# Patient Record
Sex: Female | Born: 1940 | Race: White | Hispanic: No | Marital: Married | Smoking: Current every day smoker
Health system: Southern US, Community
[De-identification: ages and names within clinical notes are randomized; demographics above are authoritative.]

## PROBLEM LIST (undated history)

## (undated) DIAGNOSIS — I10 Essential (primary) hypertension: Secondary | ICD-10-CM

## (undated) DIAGNOSIS — J449 Chronic obstructive pulmonary disease, unspecified: Secondary | ICD-10-CM

## (undated) HISTORY — PX: ABDOMINAL HYSTERECTOMY: SHX81

---

## 2004-06-28 ENCOUNTER — Ambulatory Visit: Payer: Self-pay | Admitting: Internal Medicine

## 2004-11-15 ENCOUNTER — Ambulatory Visit: Payer: Self-pay | Admitting: Internal Medicine

## 2005-01-08 ENCOUNTER — Ambulatory Visit: Payer: Self-pay | Admitting: Unknown Physician Specialty

## 2005-03-25 ENCOUNTER — Ambulatory Visit: Payer: Self-pay

## 2005-10-28 ENCOUNTER — Ambulatory Visit: Payer: Self-pay | Admitting: Ophthalmology

## 2006-04-22 ENCOUNTER — Ambulatory Visit: Payer: Self-pay | Admitting: Internal Medicine

## 2007-09-16 ENCOUNTER — Ambulatory Visit: Payer: Self-pay | Admitting: Internal Medicine

## 2008-01-26 ENCOUNTER — Inpatient Hospital Stay: Payer: Self-pay | Admitting: Specialist

## 2008-01-26 ENCOUNTER — Other Ambulatory Visit: Payer: Self-pay

## 2008-04-10 ENCOUNTER — Emergency Department: Payer: Self-pay | Admitting: Emergency Medicine

## 2008-07-18 ENCOUNTER — Inpatient Hospital Stay: Payer: Self-pay | Admitting: Specialist

## 2008-09-28 ENCOUNTER — Ambulatory Visit: Payer: Self-pay | Admitting: Internal Medicine

## 2012-05-21 ENCOUNTER — Emergency Department: Payer: Self-pay | Admitting: Emergency Medicine

## 2012-05-29 ENCOUNTER — Ambulatory Visit: Payer: Self-pay | Admitting: Orthopedic Surgery

## 2013-03-29 ENCOUNTER — Inpatient Hospital Stay: Payer: Self-pay | Admitting: Internal Medicine

## 2013-03-29 LAB — URINALYSIS, COMPLETE
Bilirubin,UR: NEGATIVE
Blood: NEGATIVE
Glucose,UR: NEGATIVE mg/dL (ref 0–75)
Leukocyte Esterase: NEGATIVE
Nitrite: NEGATIVE
Ph: 9 (ref 4.5–8.0)
Protein: NEGATIVE
Squamous Epithelial: 1
WBC UR: <1 /HPF (ref 0–5)

## 2013-03-29 LAB — CBC
HCT: 47.5 % — ABNORMAL HIGH (ref 35.0–47.0)
MCH: 28.8 pg (ref 26.0–34.0)
WBC: 18.1 10*3/uL — ABNORMAL HIGH (ref 3.6–11.0)

## 2013-03-29 LAB — COMPREHENSIVE METABOLIC PANEL
Albumin: 3.7 g/dL (ref 3.4–5.0)
Alkaline Phosphatase: 112 U/L (ref 50–136)
Anion Gap: 4 — ABNORMAL LOW (ref 7–16)
Bilirubin,Total: 0.8 mg/dL (ref 0.2–1.0)
Calcium, Total: 9.6 mg/dL (ref 8.5–10.1)
Chloride: 90 mmol/L — ABNORMAL LOW (ref 98–107)
Co2: 35 mmol/L — ABNORMAL HIGH (ref 21–32)
Creatinine: 0.53 mg/dL — ABNORMAL LOW (ref 0.60–1.30)
Osmolality: 259 (ref 275–301)
Potassium: 4.2 mmol/L (ref 3.5–5.1)
SGPT (ALT): 32 U/L (ref 12–78)
Sodium: 129 mmol/L — ABNORMAL LOW (ref 136–145)
Total Protein: 8.1 g/dL (ref 6.4–8.2)

## 2013-03-29 LAB — CK TOTAL AND CKMB (NOT AT ARMC)
CK, Total: 156 U/L (ref 21–215)
CK-MB: 3.6 ng/mL (ref 0.5–3.6)

## 2013-03-30 LAB — CBC WITH DIFFERENTIAL/PLATELET
Basophil #: 0.1 10*3/uL (ref 0.0–0.1)
Eosinophil #: 0 10*3/uL (ref 0.0–0.7)
Eosinophil %: 0.3 %
HCT: 38.7 % (ref 35.0–47.0)
HGB: 13.2 g/dL (ref 12.0–16.0)
MCHC: 34.1 g/dL (ref 32.0–36.0)
Monocyte #: 0.8 x10 3/mm (ref 0.2–0.9)
Neutrophil #: 9 10*3/uL — ABNORMAL HIGH (ref 1.4–6.5)
Neutrophil %: 82.9 %
Platelet: 207 10*3/uL (ref 150–440)
RBC: 4.54 10*6/uL (ref 3.80–5.20)

## 2013-03-30 LAB — BASIC METABOLIC PANEL
Anion Gap: 4 — ABNORMAL LOW (ref 7–16)
BUN: 14 mg/dL (ref 7–18)
Chloride: 95 mmol/L — ABNORMAL LOW (ref 98–107)
Co2: 35 mmol/L — ABNORMAL HIGH (ref 21–32)
EGFR (Non-African Amer.): >60
Potassium: 3.9 mmol/L (ref 3.5–5.1)

## 2013-04-02 LAB — CBC WITH DIFFERENTIAL/PLATELET
Basophil #: 0.4 10*3/uL — ABNORMAL HIGH (ref 0.0–0.1)
Eosinophil #: 0 10*3/uL (ref 0.0–0.7)
Eosinophil %: 0.1 %
HCT: 43.6 % (ref 35.0–47.0)
HGB: 14.8 g/dL (ref 12.0–16.0)
Lymphocyte #: 0.2 10*3/uL — ABNORMAL LOW (ref 1.0–3.6)
Lymphocyte %: 0.9 %
MCH: 28.3 pg (ref 26.0–34.0)
MCHC: 33.8 g/dL (ref 32.0–36.0)
MCV: 84 fL (ref 80–100)
Monocyte #: 0.8 x10 3/mm (ref 0.2–0.9)
Monocyte %: 3.1 %
Neutrophil #: 25 10*3/uL — ABNORMAL HIGH (ref 1.4–6.5)
Neutrophil %: 94.5 %
RBC: 5.21 10*6/uL — ABNORMAL HIGH (ref 3.80–5.20)
RDW: 13.5 % (ref 11.5–14.5)
WBC: 26.4 10*3/uL — ABNORMAL HIGH (ref 3.6–11.0)

## 2013-04-03 LAB — CBC WITH DIFFERENTIAL/PLATELET
Basophil #: 0.1 10*3/uL (ref 0.0–0.1)
Eosinophil #: 0 10*3/uL (ref 0.0–0.7)
Eosinophil %: 0 %
HCT: 41.7 % (ref 35.0–47.0)
HGB: 13.9 g/dL (ref 12.0–16.0)
Lymphocyte #: 0.3 10*3/uL — ABNORMAL LOW (ref 1.0–3.6)
Lymphocyte %: 1.1 %
MCHC: 33.3 g/dL (ref 32.0–36.0)
MCV: 85 fL (ref 80–100)
Monocyte #: 0.6 x10 3/mm (ref 0.2–0.9)
RDW: 13.7 % (ref 11.5–14.5)

## 2013-04-04 LAB — CREATININE, SERUM
Creatinine: 0.77 mg/dL (ref 0.60–1.30)
EGFR (African American): >60

## 2013-04-05 LAB — CBC WITH DIFFERENTIAL/PLATELET
HCT: 38 % (ref 35.0–47.0)
Lymphocyte %: 1.6 %
MCH: 28.5 pg (ref 26.0–34.0)
MCV: 85 fL (ref 80–100)
Monocyte %: 3.5 %
Neutrophil %: 94.9 %
Platelet: 280 10*3/uL (ref 150–440)
RBC: 4.49 10*6/uL (ref 3.80–5.20)
RDW: 13.5 % (ref 11.5–14.5)

## 2013-04-06 LAB — VANCOMYCIN, TROUGH: Vancomycin, Trough: 4 ug/mL — ABNORMAL LOW (ref 10–20)

## 2014-02-16 ENCOUNTER — Emergency Department: Payer: Self-pay | Admitting: Internal Medicine

## 2014-02-16 LAB — COMPREHENSIVE METABOLIC PANEL
ALK PHOS: 99 U/L
ANION GAP: 3 — AB (ref 7–16)
Albumin: 3.5 g/dL (ref 3.4–5.0)
BILIRUBIN TOTAL: 0.6 mg/dL (ref 0.2–1.0)
BUN: 47 mg/dL — AB (ref 7–18)
CALCIUM: 9.1 mg/dL (ref 8.5–10.1)
CO2: 43 mmol/L — AB (ref 21–32)
Chloride: 87 mmol/L — ABNORMAL LOW (ref 98–107)
Creatinine: 0.86 mg/dL (ref 0.60–1.30)
EGFR (African American): >60
EGFR (Non-African Amer.): >60
Glucose: 115 mg/dL — ABNORMAL HIGH (ref 65–99)
Osmolality: 280 (ref 275–301)
Potassium: 4.7 mmol/L (ref 3.5–5.1)
SGOT(AST): 43 U/L — ABNORMAL HIGH (ref 15–37)
SGPT (ALT): 31 U/L
Sodium: 133 mmol/L — ABNORMAL LOW (ref 136–145)
TOTAL PROTEIN: 7.9 g/dL (ref 6.4–8.2)

## 2014-02-16 LAB — CK TOTAL AND CKMB (NOT AT ARMC)
CK, Total: 168 U/L
CK-MB: 6.5 ng/mL — AB (ref 0.5–3.6)

## 2014-02-16 LAB — URINALYSIS, COMPLETE
BILIRUBIN, UR: NEGATIVE
GLUCOSE, UR: NEGATIVE mg/dL (ref 0–75)
KETONE: NEGATIVE
LEUKOCYTE ESTERASE: NEGATIVE
Nitrite: POSITIVE
PH: 5 (ref 4.5–8.0)
Protein: NEGATIVE
SPECIFIC GRAVITY: 1.01 (ref 1.003–1.030)

## 2014-02-16 LAB — TROPONIN I: Troponin-I: 0.02 ng/mL

## 2014-02-16 LAB — CBC
HCT: 43.2 % (ref 35.0–47.0)
HGB: 13.9 g/dL (ref 12.0–16.0)
MCH: 27.8 pg (ref 26.0–34.0)
MCHC: 32.3 g/dL (ref 32.0–36.0)
MCV: 86 fL (ref 80–100)
PLATELETS: 212 10*3/uL (ref 150–440)
RBC: 5.01 10*6/uL (ref 3.80–5.20)
RDW: 14.2 % (ref 11.5–14.5)
WBC: 9.6 10*3/uL (ref 3.6–11.0)

## 2014-02-20 ENCOUNTER — Emergency Department (HOSPITAL_COMMUNITY): Payer: Medicare HMO

## 2014-02-20 ENCOUNTER — Encounter (HOSPITAL_COMMUNITY): Payer: Self-pay | Admitting: Emergency Medicine

## 2014-02-20 ENCOUNTER — Inpatient Hospital Stay (HOSPITAL_COMMUNITY)
Admission: EM | Admit: 2014-02-20 | Discharge: 2014-02-24 | DRG: 190 | Disposition: A | Discharge status: Home Health | Payer: Medicare HMO | Attending: Internal Medicine | Admitting: Internal Medicine

## 2014-02-20 DIAGNOSIS — J441 Chronic obstructive pulmonary disease with (acute) exacerbation: Secondary | ICD-10-CM | POA: Diagnosis not present

## 2014-02-20 DIAGNOSIS — A0472 Enterocolitis due to Clostridium difficile, not specified as recurrent: Secondary | ICD-10-CM

## 2014-02-20 DIAGNOSIS — R7309 Other abnormal glucose: Secondary | ICD-10-CM | POA: Diagnosis not present

## 2014-02-20 DIAGNOSIS — Y92009 Unspecified place in unspecified non-institutional (private) residence as the place of occurrence of the external cause: Secondary | ICD-10-CM

## 2014-02-20 DIAGNOSIS — R9389 Abnormal findings on diagnostic imaging of other specified body structures: Secondary | ICD-10-CM

## 2014-02-20 DIAGNOSIS — F17200 Nicotine dependence, unspecified, uncomplicated: Secondary | ICD-10-CM | POA: Diagnosis present

## 2014-02-20 DIAGNOSIS — J9621 Acute and chronic respiratory failure with hypoxia: Secondary | ICD-10-CM

## 2014-02-20 DIAGNOSIS — T380X5A Adverse effect of glucocorticoids and synthetic analogues, initial encounter: Secondary | ICD-10-CM | POA: Diagnosis not present

## 2014-02-20 DIAGNOSIS — W010XXA Fall on same level from slipping, tripping and stumbling without subsequent striking against object, initial encounter: Secondary | ICD-10-CM | POA: Diagnosis present

## 2014-02-20 DIAGNOSIS — I951 Orthostatic hypotension: Secondary | ICD-10-CM

## 2014-02-20 DIAGNOSIS — J9620 Acute and chronic respiratory failure, unspecified whether with hypoxia or hypercapnia: Secondary | ICD-10-CM | POA: Diagnosis present

## 2014-02-20 DIAGNOSIS — Z23 Encounter for immunization: Secondary | ICD-10-CM

## 2014-02-20 DIAGNOSIS — R55 Syncope and collapse: Secondary | ICD-10-CM | POA: Diagnosis not present

## 2014-02-20 DIAGNOSIS — Z9981 Dependence on supplemental oxygen: Secondary | ICD-10-CM

## 2014-02-20 DIAGNOSIS — E43 Unspecified severe protein-calorie malnutrition: Secondary | ICD-10-CM | POA: Diagnosis present

## 2014-02-20 DIAGNOSIS — R918 Other nonspecific abnormal finding of lung field: Secondary | ICD-10-CM | POA: Diagnosis present

## 2014-02-20 DIAGNOSIS — I10 Essential (primary) hypertension: Secondary | ICD-10-CM

## 2014-02-20 DIAGNOSIS — Z681 Body mass index (BMI) 19 or less, adult: Secondary | ICD-10-CM

## 2014-02-20 HISTORY — DX: Essential (primary) hypertension: I10

## 2014-02-20 HISTORY — DX: Chronic obstructive pulmonary disease, unspecified: J44.9

## 2014-02-20 LAB — CBC
HCT: 45 % (ref 36.0–46.0)
HEMOGLOBIN: 14 g/dL (ref 12.0–15.0)
MCH: 28.2 pg (ref 26.0–34.0)
MCHC: 31.1 g/dL (ref 30.0–36.0)
MCV: 90.5 fL (ref 78.0–100.0)
Platelets: 192 10*3/uL (ref 150–400)
RBC: 4.97 MIL/uL (ref 3.87–5.11)
RDW: 14.4 % (ref 11.5–15.5)
WBC: 7.7 10*3/uL (ref 4.0–10.5)

## 2014-02-20 LAB — I-STAT CHEM 8, ED
BUN: 51 mg/dL — ABNORMAL HIGH (ref 6–23)
CHLORIDE: 86 meq/L — AB (ref 96–112)
Calcium, Ion: 1.12 mmol/L — ABNORMAL LOW (ref 1.13–1.30)
Creatinine, Ser: 1.3 mg/dL — ABNORMAL HIGH (ref 0.50–1.10)
GLUCOSE: 132 mg/dL — AB (ref 70–99)
HEMATOCRIT: 48 % — AB (ref 36.0–46.0)
Hemoglobin: 16.3 g/dL — ABNORMAL HIGH (ref 12.0–15.0)
Potassium: 4.1 mEq/L (ref 3.7–5.3)
Sodium: 136 mEq/L — ABNORMAL LOW (ref 137–147)
TCO2: 45 mmol/L (ref 0–100)

## 2014-02-20 LAB — COMPREHENSIVE METABOLIC PANEL
ALK PHOS: 82 U/L (ref 39–117)
ALT: 19 U/L (ref 0–35)
AST: 19 U/L (ref 0–37)
Albumin: 3.7 g/dL (ref 3.5–5.2)
Anion gap: 8 (ref 5–15)
BUN: 52 mg/dL — ABNORMAL HIGH (ref 6–23)
CHLORIDE: 89 meq/L — AB (ref 96–112)
CO2: 44 mEq/L (ref 19–32)
CREATININE: 0.98 mg/dL (ref 0.50–1.10)
Calcium: 9.4 mg/dL (ref 8.4–10.5)
GFR calc non Af Amer: 55 mL/min — ABNORMAL LOW (ref >90)
GFR, EST AFRICAN AMERICAN: 64 mL/min — AB (ref >90)
GLUCOSE: 130 mg/dL — AB (ref 70–99)
POTASSIUM: 4.3 meq/L (ref 3.7–5.3)
Sodium: 141 mEq/L (ref 137–147)
Total Bilirubin: 0.2 mg/dL — ABNORMAL LOW (ref 0.3–1.2)
Total Protein: 7.1 g/dL (ref 6.0–8.3)

## 2014-02-20 LAB — I-STAT TROPONIN, ED: Troponin i, poc: 0.02 ng/mL (ref 0.00–0.08)

## 2014-02-20 SURGERY — LEFT HEART CATH
Anesthesia: LOCAL | Laterality: Right

## 2014-02-20 MED ORDER — IPRATROPIUM BROMIDE 0.02 % IN SOLN
1.0000 mg | Freq: Once | RESPIRATORY_TRACT | Status: AC
  Administered 2014-02-20: 1 mg via RESPIRATORY_TRACT
  Filled 2014-02-20: qty 5

## 2014-02-20 MED ORDER — METHYLPREDNISOLONE SODIUM SUCC 125 MG IJ SOLR
125.0000 mg | Freq: Once | INTRAMUSCULAR | Status: AC
  Administered 2014-02-20: 125 mg via INTRAVENOUS
  Filled 2014-02-20: qty 2

## 2014-02-20 MED ORDER — IPRATROPIUM-ALBUTEROL 0.5-2.5 (3) MG/3ML IN SOLN: 3.0000 mL | RESPIRATORY_TRACT | Status: DC

## 2014-02-20 MED ORDER — ALBUTEROL (5 MG/ML) CONTINUOUS INHALATION SOLN
10.0000 mg/h | INHALATION_SOLUTION | Freq: Once | RESPIRATORY_TRACT | Status: AC
  Administered 2014-02-20: 10 mg/h via RESPIRATORY_TRACT
  Filled 2014-02-20: qty 20

## 2014-02-20 MED ORDER — IPRATROPIUM-ALBUTEROL 0.5-2.5 (3) MG/3ML IN SOLN
3.0000 mL | Freq: Once | RESPIRATORY_TRACT | Status: AC
  Administered 2014-02-20: 3 mL via RESPIRATORY_TRACT
  Filled 2014-02-20: qty 3

## 2014-02-20 NOTE — ED Notes (Signed)
Pt to ED via EMS with code STEMI called on route. Per EMS pt found fell over a table and complaining generalized body ache, EKG Showed ST-elevation. EMS given 324mg  ASA, 1 inch nitro past. Per EMS, BP-142/68, CBG-131, O2-95% on 2L.

## 2014-02-20 NOTE — ED Notes (Signed)
Code STEMI cancelled per Dr. Gwendolyn GrantWalden and cardiologist.

## 2014-02-20 NOTE — ED Provider Notes (Signed)
CSN: [id number]     Arrival date & time 02/20/14  2248 History   First MD Initiated Contact with Patient 02/20/14 2304     Chief Complaint  Patient presents with  . Code STEMI     (Consider location/radiation/quality/duration/timing/severity/associated sxs/prior Treatment) Patient is a 73 y.o. female presenting with syncope. The history is provided by the patient.  Loss of Consciousness Episode history:  Single Most recent episode:  Today Timing:  Constant Progression:  Resolved Chronicity:  New Context comment:  Was eating Witnessed: yes   Relieved by:  Nothing Worsened by:  Nothing tried Associated symptoms: chest pain, difficulty breathing and shortness of breath   Associated symptoms: no diaphoresis, no fever, no nausea, no recent injury and no vomiting     Past Medical History  Diagnosis Date  . COPD (chronic obstructive pulmonary disease)    History reviewed. No pertinent past surgical history. History reviewed. No pertinent family history. History  Substance Use Topics  . Smoking status: Current Every Day Smoker -- 1.00 packs/day    Types: Cigarettes  . Smokeless tobacco: Not on file  . Alcohol Use: Not on file   OB History   Grav Para Term Preterm Abortions TAB SAB Ect Mult Living                 Review of Systems  Constitutional: Negative for fever and diaphoresis.  Respiratory: Positive for shortness of breath. Negative for cough.   Cardiovascular: Positive for chest pain and syncope.  Gastrointestinal: Negative for nausea and vomiting.  All other systems reviewed and are negative.     Allergies  Review of patient's allergies indicates no known allergies.  Home Medications   Prior to Admission medications   Not on File   BP 128/76  Pulse 90  Resp 11  SpO2 100% Physical Exam  Nursing note and vitals reviewed. Constitutional: She is oriented to person, place, and time. She appears well-developed and well-nourished. She appears distressed.   HENT:  Head: Normocephalic and atraumatic.  Mouth/Throat: Oropharynx is clear and moist.  Eyes: EOM are normal. Pupils are equal, round, and reactive to light.  Neck: Normal range of motion. Neck supple.  Cardiovascular: Normal rate and regular rhythm.  Exam reveals no friction rub.   No murmur heard. Pulmonary/Chest: She is in respiratory distress. She has wheezes (diffuse, moderate). She has no rales.  Prolonged expiratory phase  Abdominal: Soft. She exhibits no distension. There is no tenderness. There is no rebound.  Musculoskeletal: Normal range of motion. She exhibits no edema.  Neurological: She is alert and oriented to person, place, and time. No cranial nerve deficit. She exhibits normal muscle tone. Coordination normal.  Skin: No rash noted. She is not diaphoretic.    ED Course  Procedures (including critical care time) Labs Review Labs Reviewed  I-STAT CHEM 8, ED - Abnormal; Notable for the following:    Sodium 136 (*)    Chloride 86 (*)    BUN 51 (*)    Creatinine, Ser 1.30 (*)    Glucose, Bld 132 (*)    Calcium, Ion 1.12 (*)    Hemoglobin 16.3 (*)    HCT 48.0 (*)    All other components within normal limits  CBC  COMPREHENSIVE METABOLIC PANEL  I-STAT TROPOININ, ED    Imaging Review No results found.   EKG Interpretation None      MDM   Final diagnoses:  COPD exacerbation    49F with hx of COPD presents as  Code STEMI. Syncopized while eating, complaining of generalized body aches, difficulty breathing on EMS arrival. EKG concerning for STEMI to EMS in V3 and V4. Code STEMI cancelled once we reviewed EKG. Having prolonged expiratory phase with diffuse wheezing and SOB. Satting ok, but visibly in respiratory distress. Will put on BiPap and give albuterol/ipratropium. After one hour on BiPap, great improvement. Labs ok. Patient feeling better. Will admit due to patient's severe respiratory distress upon arrival for further management.    Dagmar Hait, MD 02/21/14 [phone]

## 2014-02-21 ENCOUNTER — Emergency Department (HOSPITAL_COMMUNITY): Payer: Medicare HMO

## 2014-02-21 ENCOUNTER — Encounter (HOSPITAL_COMMUNITY): Payer: Self-pay | Admitting: Internal Medicine

## 2014-02-21 DIAGNOSIS — J441 Chronic obstructive pulmonary disease with (acute) exacerbation: Secondary | ICD-10-CM | POA: Diagnosis present

## 2014-02-21 DIAGNOSIS — Z23 Encounter for immunization: Secondary | ICD-10-CM | POA: Diagnosis not present

## 2014-02-21 DIAGNOSIS — J9620 Acute and chronic respiratory failure, unspecified whether with hypoxia or hypercapnia: Secondary | ICD-10-CM | POA: Insufficient documentation

## 2014-02-21 DIAGNOSIS — W010XXA Fall on same level from slipping, tripping and stumbling without subsequent striking against object, initial encounter: Secondary | ICD-10-CM | POA: Diagnosis present

## 2014-02-21 DIAGNOSIS — R918 Other nonspecific abnormal finding of lung field: Secondary | ICD-10-CM | POA: Diagnosis present

## 2014-02-21 DIAGNOSIS — R55 Syncope and collapse: Secondary | ICD-10-CM

## 2014-02-21 DIAGNOSIS — Z681 Body mass index (BMI) 19 or less, adult: Secondary | ICD-10-CM | POA: Diagnosis not present

## 2014-02-21 DIAGNOSIS — Y92009 Unspecified place in unspecified non-institutional (private) residence as the place of occurrence of the external cause: Secondary | ICD-10-CM | POA: Diagnosis not present

## 2014-02-21 DIAGNOSIS — T380X5A Adverse effect of glucocorticoids and synthetic analogues, initial encounter: Secondary | ICD-10-CM | POA: Diagnosis not present

## 2014-02-21 DIAGNOSIS — E43 Unspecified severe protein-calorie malnutrition: Secondary | ICD-10-CM | POA: Diagnosis present

## 2014-02-21 DIAGNOSIS — Z9981 Dependence on supplemental oxygen: Secondary | ICD-10-CM | POA: Diagnosis not present

## 2014-02-21 DIAGNOSIS — F17200 Nicotine dependence, unspecified, uncomplicated: Secondary | ICD-10-CM | POA: Diagnosis present

## 2014-02-21 DIAGNOSIS — I951 Orthostatic hypotension: Secondary | ICD-10-CM | POA: Diagnosis present

## 2014-02-21 DIAGNOSIS — R0902 Hypoxemia: Secondary | ICD-10-CM

## 2014-02-21 DIAGNOSIS — I10 Essential (primary) hypertension: Secondary | ICD-10-CM | POA: Diagnosis present

## 2014-02-21 DIAGNOSIS — I369 Nonrheumatic tricuspid valve disorder, unspecified: Secondary | ICD-10-CM

## 2014-02-21 DIAGNOSIS — R7309 Other abnormal glucose: Secondary | ICD-10-CM | POA: Diagnosis not present

## 2014-02-21 DIAGNOSIS — A0472 Enterocolitis due to Clostridium difficile, not specified as recurrent: Secondary | ICD-10-CM | POA: Diagnosis present

## 2014-02-21 LAB — COMPREHENSIVE METABOLIC PANEL
ALT: 15 U/L (ref 0–35)
AST: 15 U/L (ref 0–37)
Albumin: 3.1 g/dL — ABNORMAL LOW (ref 3.5–5.2)
Alkaline Phosphatase: 72 U/L (ref 39–117)
Anion gap: 7 (ref 5–15)
BUN: 48 mg/dL — ABNORMAL HIGH (ref 6–23)
CALCIUM: 8.8 mg/dL (ref 8.4–10.5)
CO2: 45 meq/L — AB (ref 19–32)
Chloride: 88 mEq/L — ABNORMAL LOW (ref 96–112)
Creatinine, Ser: 0.96 mg/dL (ref 0.50–1.10)
GFR, EST AFRICAN AMERICAN: 66 mL/min — AB (ref >90)
GFR, EST NON AFRICAN AMERICAN: 57 mL/min — AB (ref >90)
GLUCOSE: 307 mg/dL — AB (ref 70–99)
Potassium: 4.2 mEq/L (ref 3.7–5.3)
Sodium: 140 mEq/L (ref 137–147)
Total Bilirubin: <0.2 mg/dL — ABNORMAL LOW (ref 0.3–1.2)
Total Protein: 5.9 g/dL — ABNORMAL LOW (ref 6.0–8.3)

## 2014-02-21 LAB — CBC WITH DIFFERENTIAL/PLATELET
Basophils Absolute: 0 10*3/uL (ref 0.0–0.1)
Basophils Relative: 0 % (ref 0–1)
Eosinophils Absolute: 0 10*3/uL (ref 0.0–0.7)
Eosinophils Relative: 0 % (ref 0–5)
HCT: 42.9 % (ref 36.0–46.0)
Hemoglobin: 13 g/dL (ref 12.0–15.0)
LYMPHS ABS: 0.1 10*3/uL — AB (ref 0.7–4.0)
Lymphocytes Relative: 1 % — ABNORMAL LOW (ref 12–46)
MCH: 27.9 pg (ref 26.0–34.0)
MCHC: 30.3 g/dL (ref 30.0–36.0)
MCV: 92.1 fL (ref 78.0–100.0)
Monocytes Absolute: 0 10*3/uL — ABNORMAL LOW (ref 0.1–1.0)
Monocytes Relative: 0 % — ABNORMAL LOW (ref 3–12)
NEUTROS ABS: 7.5 10*3/uL (ref 1.7–7.7)
NEUTROS PCT: 98 % — AB (ref 43–77)
PLATELETS: 172 10*3/uL (ref 150–400)
RBC: 4.66 MIL/uL (ref 3.87–5.11)
RDW: 14.3 % (ref 11.5–15.5)
WBC: 7.6 10*3/uL (ref 4.0–10.5)

## 2014-02-21 LAB — URINALYSIS, ROUTINE W REFLEX MICROSCOPIC
Bilirubin Urine: NEGATIVE
Glucose, UA: 100 mg/dL — AB
HGB URINE DIPSTICK: NEGATIVE
KETONES UR: NEGATIVE mg/dL
NITRITE: NEGATIVE
Protein, ur: NEGATIVE mg/dL
Specific Gravity, Urine: 1.015 (ref 1.005–1.030)
Urobilinogen, UA: 0.2 mg/dL (ref 0.0–1.0)
pH: 7 (ref 5.0–8.0)

## 2014-02-21 LAB — D-DIMER, QUANTITATIVE: D-Dimer, Quant: 0.42 ug/mL-FEU (ref 0.00–0.48)

## 2014-02-21 LAB — PRO B NATRIURETIC PEPTIDE: Pro B Natriuretic peptide (BNP): 216.7 pg/mL — ABNORMAL HIGH (ref 0–125)

## 2014-02-21 LAB — URINE MICROSCOPIC-ADD ON

## 2014-02-21 LAB — TROPONIN I: Troponin I: <0.3 ng/mL (ref <0.30)

## 2014-02-21 MED ORDER — ONDANSETRON HCL 4 MG PO TABS: 4.0000 mg | ORAL_TABLET | Freq: Four times a day (QID) | ORAL | Status: DC | PRN

## 2014-02-21 MED ORDER — DOXYCYCLINE HYCLATE 100 MG PO TABS
100.0000 mg | ORAL_TABLET | Freq: Once | ORAL | Status: AC
  Administered 2014-02-21: 100 mg via ORAL
  Filled 2014-02-21: qty 1

## 2014-02-21 MED ORDER — PREDNISONE 20 MG PO TABS
60.0000 mg | ORAL_TABLET | Freq: Once | ORAL | Status: AC
  Administered 2014-02-21: 60 mg via ORAL
  Filled 2014-02-21: qty 3

## 2014-02-21 MED ORDER — LEVOFLOXACIN 500 MG PO TABS
500.0000 mg | ORAL_TABLET | Freq: Once | ORAL | Status: AC
  Administered 2014-02-21: 500 mg via ORAL
  Filled 2014-02-21: qty 1

## 2014-02-21 MED ORDER — TETANUS-DIPHTH-ACELL PERTUSSIS 5-2.5-18.5 LF-MCG/0.5 IM SUSP
0.5000 mL | Freq: Once | INTRAMUSCULAR | Status: AC
  Administered 2014-02-21: 0.5 mL via INTRAMUSCULAR
  Filled 2014-02-21 (×2): qty 0.5

## 2014-02-21 MED ORDER — POTASSIUM CHLORIDE CRYS ER 20 MEQ PO TBCR
20.0000 meq | EXTENDED_RELEASE_TABLET | Freq: Every day | ORAL | Status: DC
  Filled 2014-02-21: qty 1

## 2014-02-21 MED ORDER — SODIUM CHLORIDE 0.9 % IV SOLN
Freq: Once | INTRAVENOUS | Status: AC
  Administered 2014-02-21: 12:00:00 via INTRAVENOUS

## 2014-02-21 MED ORDER — ENOXAPARIN SODIUM 30 MG/0.3ML ~~LOC~~ SOLN
15.0000 mg | SUBCUTANEOUS | Status: DC
  Administered 2014-02-21 – 2014-02-24 (×4): 15 mg via SUBCUTANEOUS
  Filled 2014-02-21 (×6): qty 0.15

## 2014-02-21 MED ORDER — IPRATROPIUM BROMIDE 0.02 % IN SOLN: 0.5000 mg | Freq: Four times a day (QID) | RESPIRATORY_TRACT | Status: DC

## 2014-02-21 MED ORDER — ALPRAZOLAM 0.25 MG PO TABS
0.2500 mg | ORAL_TABLET | Freq: Three times a day (TID) | ORAL | Status: DC | PRN
  Administered 2014-02-21 – 2014-02-23 (×3): 0.25 mg via ORAL
  Filled 2014-02-21 (×3): qty 1

## 2014-02-21 MED ORDER — BUDESONIDE 0.25 MG/2ML IN SUSP
0.2500 mg | Freq: Two times a day (BID) | RESPIRATORY_TRACT | Status: DC
  Administered 2014-02-21 – 2014-02-24 (×6): 0.25 mg via RESPIRATORY_TRACT
  Filled 2014-02-21 (×9): qty 2

## 2014-02-21 MED ORDER — ONDANSETRON HCL 4 MG/2ML IJ SOLN: 4.0000 mg | Freq: Four times a day (QID) | INTRAMUSCULAR | Status: DC | PRN

## 2014-02-21 MED ORDER — ENOXAPARIN SODIUM 40 MG/0.4ML ~~LOC~~ SOLN
40.0000 mg | SUBCUTANEOUS | Status: DC
  Filled 2014-02-21: qty 0.4

## 2014-02-21 MED ORDER — SODIUM CHLORIDE 0.9 % IJ SOLN
3.0000 mL | Freq: Two times a day (BID) | INTRAMUSCULAR | Status: DC
Start: 2014-02-21 — End: 2014-02-24
  Administered 2014-02-21 – 2014-02-24 (×7): 3 mL via INTRAVENOUS

## 2014-02-21 MED ORDER — ALBUTEROL SULFATE (2.5 MG/3ML) 0.083% IN NEBU: 2.5000 mg | INHALATION_SOLUTION | RESPIRATORY_TRACT | Status: DC

## 2014-02-21 MED ORDER — ALBUTEROL SULFATE (2.5 MG/3ML) 0.083% IN NEBU
2.5000 mg | INHALATION_SOLUTION | RESPIRATORY_TRACT | Status: DC | PRN
  Administered 2014-02-21 – 2014-02-22 (×2): 2.5 mg via RESPIRATORY_TRACT
  Filled 2014-02-21 (×2): qty 3

## 2014-02-21 MED ORDER — TRAZODONE HCL 50 MG PO TABS
50.0000 mg | ORAL_TABLET | Freq: Every evening | ORAL | Status: DC | PRN
  Administered 2014-02-21: 50 mg via ORAL
  Filled 2014-02-21: qty 1

## 2014-02-21 MED ORDER — ENSURE COMPLETE PO LIQD
237.0000 mL | Freq: Three times a day (TID) | ORAL | Status: DC
  Administered 2014-02-21 – 2014-02-24 (×9): 237 mL via ORAL

## 2014-02-21 MED ORDER — METHYLPREDNISOLONE SODIUM SUCC 125 MG IJ SOLR
60.0000 mg | Freq: Four times a day (QID) | INTRAMUSCULAR | Status: AC
  Administered 2014-02-21 – 2014-02-22 (×6): 60 mg via INTRAVENOUS
  Filled 2014-02-21 (×8): qty 0.96

## 2014-02-21 MED ORDER — DILTIAZEM HCL ER 120 MG PO CP24
120.0000 mg | ORAL_CAPSULE | Freq: Every day | ORAL | Status: DC
  Administered 2014-02-21 – 2014-02-24 (×4): 120 mg via ORAL
  Filled 2014-02-21 (×5): qty 1

## 2014-02-21 MED ORDER — LEVOFLOXACIN 250 MG PO TABS
250.0000 mg | ORAL_TABLET | Freq: Every day | ORAL | Status: DC
  Administered 2014-02-22 – 2014-02-23 (×2): 250 mg via ORAL
  Filled 2014-02-21 (×2): qty 1

## 2014-02-21 MED ORDER — DOXYCYCLINE HYCLATE 100 MG IV SOLR
100.0000 mg | Freq: Two times a day (BID) | INTRAVENOUS | Status: DC
  Filled 2014-02-21 (×2): qty 100

## 2014-02-21 MED ORDER — ACETAMINOPHEN 650 MG RE SUPP: 650.0000 mg | Freq: Four times a day (QID) | RECTAL | Status: DC | PRN

## 2014-02-21 MED ORDER — ACETAMINOPHEN 325 MG PO TABS
650.0000 mg | ORAL_TABLET | Freq: Four times a day (QID) | ORAL | Status: DC | PRN
  Administered 2014-02-22: 650 mg via ORAL
  Filled 2014-02-21: qty 2

## 2014-02-21 MED ORDER — TORSEMIDE 10 MG PO TABS
10.0000 mg | ORAL_TABLET | Freq: Every day | ORAL | Status: DC
  Filled 2014-02-21: qty 1

## 2014-02-21 MED ORDER — IPRATROPIUM-ALBUTEROL 0.5-2.5 (3) MG/3ML IN SOLN
3.0000 mL | Freq: Four times a day (QID) | RESPIRATORY_TRACT | Status: DC
  Administered 2014-02-21 – 2014-02-22 (×6): 3 mL via RESPIRATORY_TRACT
  Filled 2014-02-21 (×6): qty 3

## 2014-02-21 MED ORDER — IPRATROPIUM-ALBUTEROL 0.5-2.5 (3) MG/3ML IN SOLN: 3.0000 mL | Freq: Three times a day (TID) | RESPIRATORY_TRACT | Status: DC

## 2014-02-21 NOTE — Progress Notes (Signed)
  Echocardiogram 2D Echocardiogram has been performed.  Mindy Daniels, [name] 02/21/2014, 10:19 AM

## 2014-02-21 NOTE — H&P (Signed)
[facility] History and Physical  Mindy FoundMartha J Snarski GNF:[id number] DOB: 06/07/1941 DOA: 02/20/2014  Referring physician: ER physician. PCP: No primary provider on file.   Chief Complaint: Fall.  HPI: Mindy Daniels is a 73 y.o. female with history of COPD on home oxygen and ongoing tobacco abuse, hypertension was brought to the ER after patient had a fall at her house. Patient states that she fell over her coffee table. Patient states that she is not able to explain exactly what happened but she suddenly lost balance and fell and was unable to get up and had to call her neighbor. Her neighbor called the EMS and when EMS reached the house patient was complaining of generalized pain and EKG done was showing nonspecific ST-T changes in V3 and V4 was initially brought in as code STEMI. But was canceled later. On arrival patient was found to be wheezing and short of breath and was initially placed on BiPAP and so was discontinued after patient breathing room nebulizer. Patient recently was taken to the urgent care Center 2 days ago for hypoxia and was placed on Zithromax for bronchitis along with prednisone which patient states she had taken the 2 day course prescribed. Patient denies any nausea vomiting abdominal pain diarrhea fever chills headache visual symptoms. On exam patient is nonfocal. Patient has generalized body ache and upper back pain which has been chronic. Patient will be admitted for further observation. On exam patient presently is not wheezing. Chest x-ray shows nonspecific opacity in the left upper lobe.   Review of Systems: As presented in the history of presenting illness, rest negative.  Past Medical History  Diagnosis Date  . COPD (chronic obstructive pulmonary disease)   . Hypertension    Past Surgical History  Procedure Laterality Date  . Abdominal hysterectomy     Social History:  reports that she has been smoking Cigarettes.  She has been smoking about 1.00 pack per  day. She does not have any smokeless tobacco history on file. She reports that she does not drink alcohol or use illicit drugs. Where does patient live home. Can patient participate in ADLs? Yes.  No Known Allergies  Family History:  Family History  Problem Relation Age of Onset  . CAD Neg Hx   . Diabetes Mellitus II Neg Hx   . Stroke Neg Hx       Prior to Admission medications   Medication Sig Start Date End Date Taking? Authorizing Provider  budesonide-formoterol (SYMBICORT) 160-4.5 MCG/ACT inhaler Inhale 2 puffs into the lungs 2 (two) times daily.   Yes Historical Provider, MD  diltiazem (DILACOR XR) 120 MG 24 hr capsule Take 120 mg by mouth daily.   Yes Historical Provider, MD  ipratropium (ATROVENT HFA) 17 MCG/ACT inhaler Inhale 2 puffs into the lungs every 6 (six) hours as needed for wheezing.   Yes Historical Provider, MD  ipratropium-albuterol (DUONEB) 0.5-2.5 (3) MG/3ML SOLN Take 3 mLs by nebulization every 5 (five) hours as needed (shortness of breath).   Yes Historical Provider, MD  potassium chloride SA (K-DUR,KLOR-CON) 20 MEQ tablet Take 20 mEq by mouth daily.   Yes Historical Provider, MD  torsemide (DEMADEX) 10 MG tablet Take 10 mg by mouth daily.   Yes Historical Provider, MD  traZODone (DESYREL) 50 MG tablet Take 50 mg by mouth at bedtime as needed for sleep.   Yes Historical Provider, MD  azithromycin (ZITHROMAX) 500 MG tablet Take 500 mg by mouth daily.    Historical Provider, MD  predniSONE (DELTASONE) 50 MG tablet Take 50 mg by mouth daily with breakfast.    Historical Provider, MD    Physical Exam: Filed Vitals:   02/21/14 0015 02/21/14 0030 02/21/14 0045 02/21/14 0145  BP: 124/57 113/58 106/46 121/61  Pulse: 90 90 92 94  Resp: 17 14 18 15   SpO2: 100% 99% 100% 98%     General:  Poorly nourished and built.  Eyes: Anicteric no pallor.  ENT: No discharge from the ears eyes nose mouth.  Neck: No mass felt.  Cardiovascular: S1-S2 heard.  Respiratory: No  rhonchi or crepitations.  Abdomen: Soft nontender bowel sounds present.  Skin: Skin tear on the left hand and the right knee. Multiple bruises.  Musculoskeletal: Skin tear on the right knee and the left hand.  Psychiatric: Appears normal.  Neurologic: Alert and oriented to time place and person. Moves all extremities 5 x 5. No facial asymmetry. Tongue is midline.  Labs on Admission:  Basic Metabolic Panel:  Recent Labs Lab 02/20/14 2255 02/20/14 2303  NA 141 136*  K 4.3 4.1  CL 89* 86*  CO2 44*  --   GLUCOSE 130* 132*  BUN 52* 51*  CREATININE 0.98 1.30*  CALCIUM 9.4  --    Liver Function Tests:  Recent Labs Lab 02/20/14 2255  AST 19  ALT 19  ALKPHOS 82  BILITOT 0.2*  PROT 7.1  ALBUMIN 3.7   No results found for this basename: LIPASE, AMYLASE,  in the last 168 hours No results found for this basename: AMMONIA,  in the last 168 hours CBC:  Recent Labs Lab 02/20/14 2255 02/20/14 2303  WBC 7.7  --   HGB 14.0 16.3*  HCT 45.0 48.0*  MCV 90.5  --   PLT 192  --    Cardiac Enzymes: No results found for this basename: CKTOTAL, CKMB, CKMBINDEX, TROPONINI,  in the last 168 hours  BNP (last 3 results)  Recent Labs  02/20/14 2255  PROBNP 216.7*   CBG: No results found for this basename: GLUCAP,  in the last 168 hours  Radiological Exams on Admission: Ct Head Wo Contrast  02/21/2014   CLINICAL DATA:  Generalized body aches.  Fall.  Code STEMI  EXAM: CT HEAD WITHOUT CONTRAST  TECHNIQUE: Contiguous axial images were obtained from the base of the skull through the vertex without intravenous contrast.  COMPARISON:  None.  FINDINGS: Mild diffuse cerebral atrophy. Mild ventricular dilatation consistent with central atrophy. Diffuse low-attenuation changes throughout the deep white matter consistent with small vessel ischemia. No mass effect or midline shift. No abnormal extra-axial fluid collections. Gray-white matter junctions are distinct. Basal cisterns are not  effaced. No evidence of acute intracranial hemorrhage. No depressed skull fractures. Visualized paranasal sinuses and mastoid air cells are not opacified.  IMPRESSION: No acute intracranial abnormalities. Chronic atrophy and small vessel ischemic changes.   Electronically Signed   By: Burman Nieves M.D.   On: 02/21/2014 01:24   Dg Chest Portable 1 View  02/20/2014   CLINICAL DATA:  Code STEMI, shortness of breath.  EXAM: PORTABLE CHEST - 1 VIEW  COMPARISON:  Chest radiograph February 16, 2014  FINDINGS: The cardiac silhouette appears mildly enlarged, similar. Calcified aortic knob, mediastinal silhouette is nonsuspicious. No pleural effusions. Possible left upper lobe patchy airspace opacity though, patient is rotated to the left and, facial structures partially obscure the left lung apex. Increased lung volumes with similar chronic interstitial changes. No pneumothorax.  Osteopenia. Multiple EKG lines overlie the patient and  may obscure subtle underlying pathology.  IMPRESSION: Mild cardiomegaly and COPD. Possible left upper lobe airspace opacity, which could reflect confluence of shadows, consider follow-up PA and lateral views the chest when clinically able.   Electronically Signed   By: Awilda Metro   On: 02/20/2014 23:46    EKG: Independently reviewed. Normal sinus rhythm with PAC and LVH findings with nonspecific ST-T changes.  Assessment/Plan Principal Problem:   Near syncope Active Problems:   COPD exacerbation   HTN (hypertension)   1. Near-syncope - monitor in telemetry for any arrhythmias. Check 2-D echo. Since patient is complaining of upper back pain cycle cardiac markers. Check orthostatics in a.m. Check d-dimer. Physical therapy consult. 2. Abnormal chest x-ray - at this time I have placed patient on doxycycline to cover for possible pneumonia. Patient eventually will need CT chest or followup chest x-ray. 3. COPD - initially patient was having wheezing on arrival. Presently  shortness of breath is improved. I have continued patient on nebulizer and Pulmicort patient is on doxycycline for #2. 4. Hypertension - continue home medications including Lasix. 5. Tobacco abuse - tobacco cessation counseling requested. 6. Skin tear after fall - tetanus toxoid ordered.    Code Status: Full code.  Family Communication: Patient's daughter at the bedside.  Disposition Plan: Admit for observation.    Thersea Manfredonia N. [facility] Pager [phone].  If 7PM-7AM, please contact night-coverage [email or web address] Password [email or web address] 02/21/2014, 2:12 AM

## 2014-02-21 NOTE — Evaluation (Signed)
Physical Therapy Evaluation Patient Details Name: Mindy Daniels MRN: [id number] DOB: [birth year] Today's Date: 02/21/2014   History of Present Illness  Admitted after having a fall at home and then possible MI which was then canceled.  Pt has hx of COPD and is on home o2.  Clinical Impression  Pt admitted with fall at home. Pt currently with functional limitations due to the deficits listed below (see PT Problem List). Pt will benefit from skilled PT to increase their independence and safety with mobility to allow discharge to the venue listed below. Pt feels she is close to her baseline level.  Recommend HHPT for home environment/safety assessment.  Discussed with pt using RW for improved safety.     Follow Up Recommendations Home health PT    Equipment Recommendations  None recommended by PT    Recommendations for Other Services       Precautions / Restrictions Precautions Precautions: Fall Precaution Comments: 2 falls in last 6 months      Mobility  Bed Mobility Overal bed mobility: Modified Independent             General bed mobility comments: HOB elevated, but pt reports she sleeps on sofa with pillows propping her up.  Transfers Overall transfer level: Modified independent                  Ambulation/Gait Ambulation/Gait assistance: Min guard Ambulation Distance (Feet): 30 Feet Assistive device: None (reaching for counter) Gait Pattern/deviations: Shuffle;Decreased step length - right;Decreased step length - left;Trunk flexed     General Gait Details: shuffleing gait pattern with reaching out for counter.  Discussed using RW at home for increased safety. Fatigued quickly on 3 L/min with o2 88% after gait increasing to 92% at EOB.  Stairs            Wheelchair Mobility    Modified Rankin (Stroke Patients Only)       Balance Overall balance assessment: Needs assistance   Sitting balance-Leahy Scale: Good       Standing balance-Leahy  Scale: Fair                               Pertinent Vitals/Pain Denies pain when asked, but c/o soreness during transfers.  O2 92-93% at rest on 3L/min, decreased ti 88% after 30' gait.    Home Living Family/patient expects to be discharged to:: Private residence Living Arrangements: Alone Available Help at Discharge: Family Type of Home: House Home Access: Stairs to enter   [employer] of Steps: 1-2 Home Layout: One level Home Equipment: Environmental consultant - 2 wheels;Bedside commode;Shower seat Additional Comments: home o2    Prior Function Level of Independence: Independent               Hand Dominance        Extremity/Trunk Assessment   Upper Extremity Assessment: Generalized weakness           Lower Extremity Assessment: Generalized weakness      Cervical / Trunk Assessment: Kyphotic  Communication   Communication: No difficulties  Cognition Arousal/Alertness: Awake/alert Behavior During Therapy: WFL for tasks assessed/performed Overall Cognitive Status: No family/caregiver present to determine baseline cognitive functioning (Pt able to recall info re: fall, etc, but asking about calling/texting daughter when pt had called and left VM and daughter called back all during PT session.)       Memory: Decreased short-term memory  General Comments General comments (skin integrity, edema, etc.): Pt can stand without A, but tends to reach out to hold onto items to steady herself.    Exercises        Assessment/Plan    PT Assessment Patient needs continued PT services  PT Diagnosis Difficulty walking   PT Problem List Decreased activity tolerance;Decreased balance;Decreased mobility  PT Treatment Interventions Gait training;Stair training;Therapeutic activities;Functional mobility training;Therapeutic exercise   PT Goals (Current goals can be found in the Care Plan section) Acute Rehab PT Goals Patient Stated Goal: go home  ASAP PT Goal Formulation: With patient Time For Goal Achievement: 02/28/14 Potential to Achieve Goals: Good    Frequency Min 3X/week   Barriers to discharge        Co-evaluation               End of Session Equipment Utilized During Treatment: Gait belt;Oxygen Activity Tolerance: Patient limited by fatigue Patient left: in bed;with call bell/phone within reach (refuesed recliner) Nurse Communication: Mobility status         Time: [id number] PT Time Calculation (min): 32 min   Charges:   PT Evaluation $Initial PT Evaluation Tier I: 1 Procedure PT Treatments $Gait Training: 8-22 mins   PT G Codes:          Mindy Daniels 02/21/2014, 9:41 AM

## 2014-02-21 NOTE — Progress Notes (Signed)
Utilization review completed.  

## 2014-02-21 NOTE — Progress Notes (Signed)
Admitted pt to rm 3E26 from ED via stretcher, alert and oriented, denied pain at this time. Oriented to room, call bell placed within reach,, admission assessment done, orders carried out. Daughter at bedside. Filed Vitals:   02/21/14 0245  BP: 136/63  Pulse: 98  Temp: 98.4 F (36.9 C)  Resp: 18   Mindy Daniels, [address] Mitton [place]

## 2014-02-21 NOTE — Progress Notes (Signed)
PROGRESS NOTE  Francetta FoundMartha J Crist ZOX:[id number] DOB: 10/08/1940 DOA: 02/20/2014 PCP: No primary provider on file.  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 The patient is a poor historian. All of this history is obtained from review of the medical record speaking with the patient's daughter. The patient went to [facility] on 02/16/2014 and was discharged from the emergency department with azithromycin and prednisone. The patient finished a regimen on 02/19/2014. The patient had a mechanical fall on the evening prior to admission. When EMS arrived, there was concern for an abnormal EKG and STEMI was activated. This was ultimately canceled when reviewed by the ER physician. The patient was hypoxemic and placed on BiPAP the emergency department. She gradually improved and was weaned off the BiPAP. The patient continues to complain of shortness of breath, worse than usual without any chest discomfort, nausea, vomiting. She has had some subjective fevers and chills. The patient continues to smoke 5-6 cigarettes per day. Assessment/Plan: Acute on chronic respiratory failure -Secondary to COPD exacerbation -Restart intravenous Solu-Medrol -Aerosolized albuterol and Atrovent COPD exacerbation -Restart steroids -Aerosolized albuterol and Atrovent -Pulmonary hygiene -Continue antibiotics for now Orthostatic hypotension -The patient's orthostatic vitals were positive by pulse -Start intravenous fluids -Daughter relates decreased by mouth intake for the past week -Discontinue torsemide -Discontinue potassium supplementation Hypertension -continue diltiazem as the patient's blood pressure is soft -controlled Tobacco use -Tobacco cessation discussed -Patient has little desire to quit -Nicoderm patch Deconditioning -Physical therapy  evaluation   Family Communication: updated daughter on phone Disposition Plan:   Home when medically stable    Antibiotics:  Levofloxacin  02/21/14>>>    Procedures/Studies: Ct Head Wo Contrast  02/21/2014   CLINICAL DATA:  Generalized body aches.  Fall.  Code STEMI  EXAM: CT HEAD WITHOUT CONTRAST  TECHNIQUE: Contiguous axial images were obtained from the base of the skull through the vertex without intravenous contrast.  COMPARISON:  None.  FINDINGS: Mild diffuse cerebral atrophy. Mild ventricular dilatation consistent with central atrophy. Diffuse low-attenuation changes throughout the deep white matter consistent with small vessel ischemia. No mass effect or midline shift. No abnormal extra-axial fluid collections. Gray-white matter junctions are distinct. Basal cisterns are not effaced. No evidence of acute intracranial hemorrhage. No depressed skull fractures. Visualized paranasal sinuses and mastoid air cells are not opacified.  IMPRESSION: No acute intracranial abnormalities. Chronic atrophy and small vessel ischemic changes.   Electronically Signed   By: Burman NievesWilliam  Stevens M.D.   On: 02/21/2014 01:24   Dg Chest Portable 1 View  02/20/2014   CLINICAL DATA:  Code STEMI, shortness of breath.  EXAM: PORTABLE CHEST - 1 VIEW  COMPARISON:  Chest radiograph February 16, 2014  FINDINGS: The cardiac silhouette appears mildly enlarged, similar. Calcified aortic knob, mediastinal silhouette is nonsuspicious. No pleural effusions. Possible left upper lobe patchy airspace opacity though, patient is rotated to the left and, facial structures partially obscure the left lung apex. Increased lung volumes with similar chronic interstitial changes. No pneumothorax.  Osteopenia. Multiple EKG lines overlie the patient and may obscure subtle underlying pathology.  IMPRESSION: Mild cardiomegaly and COPD. Possible left upper lobe airspace opacity, which could reflect confluence of shadows, consider follow-up PA and  lateral views the chest when clinically able.   Electronically Signed   By: Awilda Metroourtnay  Bloomer   On: 02/20/2014  23:46         Subjective: Patient continues to complain of shortness of breath worse than usual. She denies any fevers, chills, chest discomfort, nausea, vomiting, diarrhea, abdominal pain, dysuria, hematuria. No rashes.  Objective: Filed Vitals:   02/21/14 1610 02/21/14 0809 02/21/14 0811 02/21/14 0812  BP: 126/58 114/61 109/70 111/58  Pulse: 94 81 33 104  Temp: 98.3 F (36.8 C) 98.6 F (37 C) 98.6 F (37 C) 98.6 F (37 C)  TempSrc: Oral Oral Oral Oral  Resp: 18 18 18 18   Height:      Weight:      SpO2: 95% 91% 98% 99%    Intake/Output Summary (Last 24 hours) at 02/21/14 0845 Last data filed at 02/21/14 0618  Gross per 24 hour  Intake      0 ml  Output    200 ml  Net   -200 ml   Weight change:  Exam:   General:  Pt is alert, follows commands appropriately, not in acute distress  HEENT: No icterus, No thrush,  [place]/AT  Cardiovascular: RRR, S1/S2, no rubs, no gallops  Respiratory: Bilateral expiratory wheezes. Diminished breath sounds bilaterally.  Abdomen: Soft/+BS, non tender, non distended, no guarding  Extremities: trace LE edema, No lymphangitis, No petechiae, No rashes, no synovitis  Data Reviewed: Basic Metabolic Panel:  Recent Labs Lab 02/20/14 2255 02/20/14 2303 02/21/14 0335  NA 141 136* 140  K 4.3 4.1 4.2  CL 89* 86* 88*  CO2 44*  --  45*  GLUCOSE 130* 132* 307*  BUN 52* 51* 48*  CREATININE 0.98 1.30* 0.96  CALCIUM 9.4  --  8.8   Liver Function Tests:  Recent Labs Lab 02/20/14 2255 02/21/14 0335  AST 19 15  ALT 19 15  ALKPHOS 82 72  BILITOT 0.2* <0.2*  PROT 7.1 5.9*  ALBUMIN 3.7 3.1*   No results found for this basename: LIPASE, AMYLASE,  in the last 168 hours No results found for this basename: AMMONIA,  in the last 168 hours CBC:  Recent Labs Lab 02/20/14 2255 02/20/14 2303 02/21/14 0335  WBC 7.7  --  7.6    NEUTROABS  --   --  7.5  HGB 14.0 16.3* 13.0  HCT 45.0 48.0* 42.9  MCV 90.5  --  92.1  PLT 192  --  172   Cardiac Enzymes:  Recent Labs Lab 02/21/14 0335  TROPONINI <0.30   BNP: No components found with this basename: POCBNP,  CBG: No results found for this basename: GLUCAP,  in the last 168 hours  No results found for this or any previous visit (from the past 240 hour(s)).   Scheduled Meds: . budesonide (PULMICORT) nebulizer solution  0.25 mg Nebulization BID  . diltiazem  120 mg Oral Daily  . doxycycline (VIBRAMYCIN) IV  100 mg Intravenous Q12H  . enoxaparin (LOVENOX) injection  40 mg Subcutaneous Q24H  . ipratropium-albuterol  3 mL Nebulization TID  . potassium chloride SA  20 mEq Oral Daily  . sodium chloride  3 mL Intravenous Q12H  . Tdap  0.5 mL Intramuscular Once  . torsemide  10 mg Oral Daily   Continuous Infusions:    Cecely Rengel, DO  [facility] Pager [phone]  If 7PM-7AM, please contact night-coverage [email or web address] Password [email or web address] 02/21/2014, 8:45 AM   LOS: 1 day

## 2014-02-21 NOTE — Progress Notes (Signed)
INITIAL NUTRITION ASSESSMENT  DOCUMENTATION CODES Per approved criteria  -Severe malnutrition in the context of chronic illness  Pt meets criteria for severe MALNUTRITION in the context of chronic illness as evidenced by severe fat and muscle wasting and reported intake <75% of estimated needs for >1 month.  INTERVENTION: Ensure Complete po TID, each supplement provides 350 kcal and 13 grams of protein  NUTRITION DIAGNOSIS: Malnutrition related to COPD as evidenced by weight loss, fat and muscle wasting, and reported intake less than estimated needs.   Goal: Pt to meet >/= 90% of their estimated nutrition needs   Monitor:  Weight trends, po intake, acceptance of supplements, labs  Reason for Assessment: MST  73 y.o. female  Admitting Dx: Near syncope  ASSESSMENT: 73 y.o. female with history of COPD on home oxygen and ongoing tobacco abuse, hypertension was brought to the ER after patient had a fall at her house.  - Pt reports that she usually weighs ~88 lbs. She is unsure when she last weighed this.  - Pt says that she eats very little at home due to lack of appetite. She says that she currently feels hungry.   Nutrition Focused Physical Exam:  Subcutaneous Fat:  Orbital Region: severe wasting Upper Arm Region: severe wasting Thoracic and Lumbar Region: severe wasting  Muscle:  Temple Region: severe wasting Clavicle Bone Region: severe wasting Clavicle and Acromion Bone Region: severe wasting Scapular Bone Region: severe wasting Dorsal Hand: severe wasting Patellar Region: severe wasting Anterior Thigh Region: severe wasting Posterior Calf Region: severe wasting  Edema: none  Height: Ht Readings from Last 1 Encounters:  02/21/14 4\' 8"  (1.422 m)    Weight: Wt Readings from Last 1 Encounters:  02/21/14 76 lb 8 oz (34.7 kg)    Ideal Body Weight: 36.3 kg  % Ideal Body Weight: 96%  Wt Readings from Last 10 Encounters:  02/21/14 76 lb 8 oz (34.7 kg)     Usual Body Weight: 88 lbs  % Usual Body Weight: 86%  BMI:  Body mass index is 17.16 kg/(m^2).  Estimated Nutritional Needs: Kcal: 1200-1400 Protein: 70-80 g Fluid: >1.4 L/day  Skin: Intact  Diet Order: Cardiac  EDUCATION NEEDS: -Education needs addressed   Intake/Output Summary (Last 24 hours) at 02/21/14 1210 Last data filed at 02/21/14 0900  Gross per 24 hour  Intake    240 ml  Output    200 ml  Net     40 ml    Last BM: prior to admission   Labs:   Recent Labs Lab 02/20/14 2255 02/20/14 2303 02/21/14 0335  NA 141 136* 140  K 4.3 4.1 4.2  CL 89* 86* 88*  CO2 44*  --  45*  BUN 52* 51* 48*  CREATININE 0.98 1.30* 0.96  CALCIUM 9.4  --  8.8  GLUCOSE 130* 132* 307*    CBG (last 3)  No results found for this basename: GLUCAP,  in the last 72 hours  Scheduled Meds: . budesonide (PULMICORT) nebulizer solution  0.25 mg Nebulization BID  . diltiazem  120 mg Oral Daily  . enoxaparin (LOVENOX) injection  15 mg Subcutaneous Q24H  . ipratropium-albuterol  3 mL Nebulization Q6H  . [START ON 02/22/2014] levofloxacin  250 mg Oral Daily  . methylPREDNISolone (SOLU-MEDROL) injection  60 mg Intravenous 4 times per day  . sodium chloride  3 mL Intravenous Q12H    Continuous Infusions:   Past Medical History  Diagnosis Date  . COPD (chronic obstructive pulmonary disease)   .  Hypertension     Past Surgical History  Procedure Laterality Date  . Abdominal hysterectomy      Ebbie Latus RD, LDN

## 2014-02-21 NOTE — ED Notes (Signed)
Ambulated pt on the unit pt ambulated well no complaints noted at this time 

## 2014-02-22 DIAGNOSIS — I10 Essential (primary) hypertension: Secondary | ICD-10-CM

## 2014-02-22 DIAGNOSIS — E43 Unspecified severe protein-calorie malnutrition: Secondary | ICD-10-CM | POA: Diagnosis present

## 2014-02-22 LAB — BASIC METABOLIC PANEL
Anion gap: 6 (ref 5–15)
BUN: 34 mg/dL — AB (ref 6–23)
CALCIUM: 9.2 mg/dL (ref 8.4–10.5)
CO2: 44 mEq/L (ref 19–32)
Chloride: 93 mEq/L — ABNORMAL LOW (ref 96–112)
Creatinine, Ser: 0.69 mg/dL (ref 0.50–1.10)
GFR calc Af Amer: >90 mL/min (ref >90)
GFR, EST NON AFRICAN AMERICAN: 84 mL/min — AB (ref >90)
Glucose, Bld: 197 mg/dL — ABNORMAL HIGH (ref 70–99)
Potassium: 4 mEq/L (ref 3.7–5.3)
Sodium: 143 mEq/L (ref 137–147)

## 2014-02-22 LAB — CLOSTRIDIUM DIFFICILE BY PCR: CDIFFPCR: POSITIVE — AB

## 2014-02-22 MED ORDER — METRONIDAZOLE 500 MG PO TABS
500.0000 mg | ORAL_TABLET | Freq: Three times a day (TID) | ORAL | Status: DC
  Administered 2014-02-22 – 2014-02-23 (×4): 500 mg via ORAL
  Filled 2014-02-22 (×6): qty 1

## 2014-02-22 MED ORDER — IPRATROPIUM-ALBUTEROL 0.5-2.5 (3) MG/3ML IN SOLN
3.0000 mL | Freq: Three times a day (TID) | RESPIRATORY_TRACT | Status: DC
  Administered 2014-02-23 – 2014-02-24 (×3): 3 mL via RESPIRATORY_TRACT
  Filled 2014-02-22 (×4): qty 3

## 2014-02-22 MED ORDER — PREDNISONE 20 MG PO TABS: 60.0000 mg | ORAL_TABLET | Freq: Every day | ORAL | Status: DC

## 2014-02-22 MED ORDER — ALBUTEROL SULFATE (2.5 MG/3ML) 0.083% IN NEBU
2.5000 mg | INHALATION_SOLUTION | RESPIRATORY_TRACT | Status: DC | PRN
  Administered 2014-02-23: 2.5 mg via RESPIRATORY_TRACT
  Filled 2014-02-22: qty 3

## 2014-02-22 MED ORDER — PREDNISONE 50 MG PO TABS
60.0000 mg | ORAL_TABLET | Freq: Every day | ORAL | Status: DC
  Administered 2014-02-23 – 2014-02-24 (×2): 60 mg via ORAL
  Filled 2014-02-22 (×4): qty 1

## 2014-02-22 MED ORDER — LEVOFLOXACIN 250 MG PO TABS: 250.0000 mg | ORAL_TABLET | Freq: Every day | ORAL | Status: DC

## 2014-02-22 MED ORDER — PREDNISONE 10 MG PO TABS: 60.0000 mg | ORAL_TABLET | Freq: Every day | ORAL | Status: AC

## 2014-02-22 MED ORDER — METRONIDAZOLE 500 MG PO TABS: 500.0000 mg | ORAL_TABLET | Freq: Three times a day (TID) | ORAL | Status: DC

## 2014-02-22 NOTE — Progress Notes (Signed)
Lab reported critical CO2 of 44 to RN this AM. CO2 was 45 on 02/22/14. Will pass on to oncoming RN and continue to monitor.

## 2014-02-22 NOTE — Progress Notes (Signed)
Report given to oncoming RN. Pt is resting in bed. No signs and symptoms of distress.

## 2014-02-22 NOTE — Progress Notes (Signed)
Co-signed for Irfat Habib RN/BSN for medication administration, assessments, Is&Os, Vital Signs, Progress Notes, Care Plans, and Patient education. Ebenezer Mccaskey M RN/BSN 

## 2014-02-22 NOTE — Progress Notes (Signed)
Patient evaluated for community based chronic disease management services with [facility] as a benefit of patient's [employer]. Spoke with patient at bedside to explain [facility].  Patient is not eligible for [facility] services because her PCP is Dr Toy CookeyErnest Eason of [place]. Made Inpatient Case Manager aware that [facility] following. Of note, [facility] Care [facility] services does not replace or interfere with any services that are arranged by inpatient case management or social work.  For additional questions or referrals please contact Anibal Hendersonim Henderson BSN RN [facility] Hospital Liaison at [phone].

## 2014-02-22 NOTE — Discharge Summary (Addendum)
Physician Discharge Summary  Mindy Daniels ZOX:[id number] DOB: 1941/07/24 DOA: 02/20/2014  PCP: Toy Cookey, MD  Admit date: 02/20/2014 Discharge date: 02/22/2014  Recommendations for Outpatient Follow-up:  1. Pt will need to follow up with PCP in 2 weeks post discharge 2. Please obtain BMP 1 week 3. Please follow up on HbA1c   Discharge Diagnoses:  Acute on chronic respiratory failure  -Secondary to COPD exacerbation  -Continued intravenous Solu-Medrol while in hospital -Aerosolized albuterol and Atrovent  -Patient has been weaned back to her usual 2.5 L -Home with prednisone 60 mg daily, decrease by 10 mg daily -levofloxacin 250mg  daily x 4 days to complete 7 days of therapy -D-dimer was neg COPD exacerbation  -Restart steroids  -Aerosolized albuterol and Atrovent  -Pulmonary hygiene   -Patient has been weaned back to her usual 2.5 L -Home with prednisone 60 mg daily, decrease by 10 mg daily -levofloxacin 250mg  daily x 4 daily to complete 7 days of therapy Orthostatic hypotension  -The patient's orthostatic vitals were positive by pulse  -Started intravenous fluids  -Daughter relates decreased by mouth intake for the past week  -Discontinue torsemide  -Discontinue potassium supplementation  -Will not restart torsemide at the time of discharge given orthostatic changes involving depletion -Patient has no peripheral edema on examination--followup with primary care provider to determine if the patient is to restart torsemide in the future Cdiff Colitis -start Flagyl 500mg  po q 8hrs x 10 days Hyperglycemia -due to steroids -HbA1c Hypertension  -continue diltiazem -controlled  Tobacco use  -Tobacco cessation discussed  -Patient has little desire to quit  -Nicoderm patch  Deconditioning  -Physical therapy--home health PT Family Communication: updated daughter on phone  Disposition Plan: Home when medically stable  Antibiotics:  Levofloxacin 02/21/14>>>   Discharge  Condition: Stable  Disposition:  home with home health PT  Diet:heart healthy Wt Readings from Last 3 Encounters:  02/22/14 34.882 kg (76 lb 14.4 oz)    History of present illness:  73 year old female with a history of hypertension and 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 The patient is a poor historian. All of this history is obtained from review of the medical record speaking with the patient's daughter. The patient went to [facility] on [facility]/16/2014 and was discharged from the emergency department with azithromycin and prednisone. The patient finished a regimen on 02/19/2014. The patient had a mechanical fall on the evening prior to admission. When EMS arrived, there was concern for an abnormal EKG and STEMI was activated. This was ultimately canceled when reviewed by the ER physician. The patient was hypoxemic and placed on BiPAP the emergency department. She gradually improved and was weaned off the BiPAP. The patient continues to complain of shortness of breath, worse than usual without any chest discomfort, nausea, vomiting. She has had some subjective fevers and chills. The patient continues to smoke 5-6 cigarettes per day. The patient was started on intravenous Solu-Medrol with good clinical response. She was subsequently weaned to oral prednisone. The patient remained clinically stable. The patient was started on levofloxacin which she tolerated. She will be discharged home with 4 more days to complete 7 days of therapy. The patient was found to be orthostatic. The patient was given 1 L of fluids. She responded nicely. The patient was instructed to discontinue her torsemide and follow up with PCP. The patient developed loose stools during her hospitalization. C. difficile PCR was checked and was positive.  The patient was started on metronidazole 500 mg every 8 hours. She will go home with a 10 day  regimen.    Discharge Exam: Filed Vitals:   02/22/14 1512  BP: 127/62  Pulse: 85  Temp: 98.6 F (37 C)  Resp: 18   Filed Vitals:   02/22/14 0906 02/22/14 0955 02/22/14 1455 02/22/14 1512  BP:  123/48  127/62  Pulse: 75 80 80 85  Temp:    98.6 F (37 C)  TempSrc:    Oral  Resp: 18  18 18   Height:      Weight:      SpO2: 95%  96% 96%   General: A&O x 3, NAD, pleasant, cooperative Cardiovascular: RRR, no rub, no gallop, no S3 Respiratory: Bibasilar rales. Minimal bibasilar wheezes. Good air movement Abdomen:soft, nontender, nondistended, positive bowel sounds Extremities: No edema, No lymphangitis, no petechiae  Discharge Instructions     Medication List    STOP taking these medications       azithromycin 500 MG tablet  Commonly known as:  ZITHROMAX     potassium chloride SA 20 MEQ tablet  Commonly known as:  K-DUR,KLOR-CON     torsemide 10 MG tablet  Commonly known as:  DEMADEX      TAKE these medications       budesonide-formoterol 160-4.5 MCG/ACT inhaler  Commonly known as:  SYMBICORT  Inhale 2 puffs into the lungs 2 (two) times daily.     diltiazem 120 MG 24 hr capsule  Commonly known as:  DILACOR XR  Take 120 mg by mouth daily.     ipratropium 17 MCG/ACT inhaler  Commonly known as:  ATROVENT HFA  Inhale 2 puffs into the lungs every 6 (six) hours as needed for wheezing.     ipratropium-albuterol 0.5-2.5 (3) MG/3ML Soln  Commonly known as:  DUONEB  Take 3 mLs by nebulization every 5 (five) hours as needed (shortness of breath).     levofloxacin 250 MG tablet  Commonly known as:  LEVAQUIN  Take 1 tablet (250 mg total) by mouth daily.     metroNIDAZOLE 500 MG tablet  Commonly known as:  FLAGYL  Take 1 tablet (500 mg total) by mouth every 8 (eight) hours.     predniSONE 10 MG tablet  Commonly known as:  DELTASONE  Take 6 tablets (60 mg total) by mouth daily with breakfast. Start 02/24/14 and decrease by 10mg  daily  Start taking on:  02/23/2014       traZODone 50 MG tablet  Commonly known as:  DESYREL  Take 50 mg by mouth at bedtime as needed for sleep.         The results of significant diagnostics from this hospitalization (including imaging, microbiology, ancillary and laboratory) are listed below for reference.    Significant Diagnostic Studies: Ct Head Wo Contrast  02/21/2014   CLINICAL DATA:  Generalized body aches.  Fall.  Code STEMI  EXAM: CT HEAD WITHOUT CONTRAST  TECHNIQUE: Contiguous axial images were obtained from the base of the skull through the vertex without intravenous contrast.  COMPARISON:  None.  FINDINGS: Mild diffuse cerebral atrophy. Mild ventricular dilatation consistent with central atrophy. Diffuse low-attenuation changes throughout the deep white matter consistent with small vessel ischemia. No mass effect or midline shift. No abnormal extra-axial fluid collections. Gray-white matter junctions are distinct. Basal cisterns are not effaced. No evidence of acute intracranial hemorrhage. No depressed skull fractures. Visualized paranasal sinuses and mastoid air cells are not opacified.  IMPRESSION:  No acute intracranial abnormalities. Chronic atrophy and small vessel ischemic changes.   Electronically Signed   By: Burman NievesWilliam  Stevens M.D.   On: 02/21/2014 01:24   Dg Chest Portable 1 View  02/20/2014   CLINICAL DATA:  Code STEMI, shortness of breath.  EXAM: PORTABLE CHEST - 1 VIEW  COMPARISON:  Chest radiograph February 16, 2014  FINDINGS: The cardiac silhouette appears mildly enlarged, similar. Calcified aortic knob, mediastinal silhouette is nonsuspicious. No pleural effusions. Possible left upper lobe patchy airspace opacity though, patient is rotated to the left and, facial structures partially obscure the left lung apex. Increased lung volumes with similar chronic interstitial changes. No pneumothorax.  Osteopenia. Multiple EKG lines overlie the patient and may obscure subtle underlying pathology.  IMPRESSION: Mild  cardiomegaly and COPD. Possible left upper lobe airspace opacity, which could reflect confluence of shadows, consider follow-up PA and lateral views the chest when clinically able.   Electronically Signed   By: Awilda Metroourtnay  Bloomer   On: 02/20/2014 23:46     Microbiology: Recent Results (from the past 240 hour(s))  CLOSTRIDIUM DIFFICILE BY PCR     Status: Abnormal   Collection Time    02/22/14  1:05 PM      Result Value Ref Range Status   C difficile by pcr POSITIVE (*) NEGATIVE Final   Comment: CRITICAL RESULT CALLED TO, READ BACK BY AND VERIFIED WITH:     U. HABIB RN 15:00 02/22/14 ([email or web address])     Labs: Basic Metabolic Panel:  Recent Labs Lab 02/20/14 2255 02/20/14 2303 02/21/14 0335 02/22/14 0531  NA 141 136* 140 143  K 4.3 4.1 4.2 4.0  CL 89* 86* 88* 93*  CO2 44*  --  45* 44*  GLUCOSE 130* 132* 307* 197*  BUN 52* 51* 48* 34*  CREATININE 0.98 1.30* 0.96 0.69  CALCIUM 9.4  --  8.8 9.2   Liver Function Tests:  Recent Labs Lab 02/20/14 2255 02/21/14 0335  AST 19 15  ALT 19 15  ALKPHOS 82 72  BILITOT 0.2* <0.2*  PROT 7.1 5.9*  ALBUMIN 3.7 3.1*   No results found for this basename: LIPASE, AMYLASE,  in the last 168 hours No results found for this basename: AMMONIA,  in the last 168 hours CBC:  Recent Labs Lab 02/20/14 2255 02/20/14 2303 02/21/14 0335  WBC 7.7  --  7.6  NEUTROABS  --   --  7.5  HGB 14.0 16.3* 13.0  HCT 45.0 48.0* 42.9  MCV 90.5  --  92.1  PLT 192  --  172   Cardiac Enzymes:  Recent Labs Lab 02/21/14 0335  TROPONINI <0.30   BNP: No components found with this basename: POCBNP,  CBG: No results found for this basename: GLUCAP,  in the last 168 hours  Time coordinating discharge:  Greater than 30 minutes  Signed:  Dearis Danis, DO [facility] Pager: [phone] 02/22/2014, 3:22 PM

## 2014-02-23 ENCOUNTER — Inpatient Hospital Stay (HOSPITAL_COMMUNITY): Payer: Medicare HMO

## 2014-02-23 DIAGNOSIS — A0472 Enterocolitis due to Clostridium difficile, not specified as recurrent: Secondary | ICD-10-CM

## 2014-02-23 LAB — BASIC METABOLIC PANEL
Anion gap: 8 (ref 5–15)
BUN: 29 mg/dL — ABNORMAL HIGH (ref 6–23)
CHLORIDE: 92 meq/L — AB (ref 96–112)
CO2: 41 mEq/L (ref 19–32)
CREATININE: 0.67 mg/dL (ref 0.50–1.10)
Calcium: 9.2 mg/dL (ref 8.4–10.5)
GFR calc non Af Amer: 84 mL/min — ABNORMAL LOW (ref >90)
Glucose, Bld: 127 mg/dL — ABNORMAL HIGH (ref 70–99)
Potassium: 4.3 mEq/L (ref 3.7–5.3)
SODIUM: 141 meq/L (ref 137–147)

## 2014-02-23 LAB — URINE CULTURE
COLONY COUNT: NO GROWTH
Culture: NO GROWTH

## 2014-02-23 LAB — HEMOGLOBIN A1C
HEMOGLOBIN A1C: 6 % — AB (ref <5.7)
Mean Plasma Glucose: 126 mg/dL — ABNORMAL HIGH (ref <117)

## 2014-02-23 MED ORDER — GUAIFENESIN-DM 100-10 MG/5ML PO SYRP
5.0000 mL | ORAL_SOLUTION | ORAL | Status: DC | PRN
  Administered 2014-02-23: 5 mL via ORAL
  Filled 2014-02-23: qty 5

## 2014-02-23 MED ORDER — VANCOMYCIN 50 MG/ML ORAL SOLUTION
125.0000 mg | Freq: Four times a day (QID) | ORAL | Status: DC
  Administered 2014-02-23 – 2014-02-24 (×4): 125 mg via ORAL
  Filled 2014-02-23 (×7): qty 2.5

## 2014-02-23 MED ORDER — GUAIFENESIN ER 600 MG PO TB12
600.0000 mg | ORAL_TABLET | Freq: Two times a day (BID) | ORAL | Status: DC
  Administered 2014-02-23 – 2014-02-24 (×4): 600 mg via ORAL
  Filled 2014-02-23 (×6): qty 1

## 2014-02-23 NOTE — Progress Notes (Signed)
Co-signed for Irfat Habib RN/BSN for medication administration, assessments, Is&Os, Vital Signs, Progress Notes, Care Plans, and Patient education. Joia Doyle M RN/BSN 

## 2014-02-23 NOTE — Care Management Note (Signed)
    Page 1 of 2   02/23/2014     10:40:54 AM CARE MANAGEMENT NOTE 02/23/2014  Patient:  Mindy Daniels,Mindy Daniels   Account Number:  [id number]  Date Initiated:  02/23/2014  Documentation initiated by:  [facility],Karsyn Rochin  Subjective/Objective Assessment:   73 y.o. female with history of COPD on home oxygen and ongoing tobacco abuse, hypertension was brought to the ER after patient had a fall. /Home alone.     Action/Plan:   monitor in telemetry for arrhythmias. Check 2-D echo; cycle cardiac markers. Check orthostatics . Check d-dimer. Physical therapy consult.//[facility].   Anticipated DC Date:  02/24/2014   Anticipated DC Plan:  HOME W HOME HEALTH SERVICES      DC Planning Services  CM consult      [facility]  [facility]ALTH   Choice offered to / List presented to:  C-4 Adult Children        HH arranged  HH-1 RN  HH-10 DISEASE MANAGEMENT  HH-2 PT  HH-4 NURSE'S AIDE      HH agency  [facility]   Status of service:   Medicare Important Message given?  YES (If response is "NO", the following Medicare IM given date fields will be blank) Date Medicare IM given:  02/23/2014 Medicare IM given by:  [facility]Hazelyn Kallen Date Additional Medicare IM given:   Additional Medicare IM given by:    Discharge Disposition:    Per UR Regulation:  Reviewed for med. necessity/level of care/duration of stay  If discussed at Long Length of Stay Meetings, dates discussed:    Comments:  02/23/14 1015 Mindy Daniels. Lucretia RoersWood, RN, BSN, [employer] [phone] Spoke with pt at bedside regarding discharge planning for [facility]. Offered pt list of home health agencies to choose from.  Pt chose [facility] to render services. Tamala BariMary Manley of [facility] notified.  No DME needs identified at this time.  Pt positive for C-Diff within last 24 hours. Plan to d/c home with Home Health tomorrow.  Contact: Henrietta Dineracy Moore (daughter) [phone](c); [phone](h)

## 2014-02-23 NOTE — Progress Notes (Signed)
Medicare Important Message given to daughter, Henrietta Dineracy Moore. Jmarion Christiano J. Lucretia RoersWood, RN, BSN, [employer] [phone].

## 2014-02-23 NOTE — Progress Notes (Signed)
Pt refused CPAP, says if she needs it she will call. RT will continue to monitor.

## 2014-02-23 NOTE — Progress Notes (Signed)
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 The patient is a poor historian. All of this history is obtained from review of the medical record speaking with the patient's daughter. The patient went to [facility] on 02/16/2014 and was discharged from the emergency department with azithromycin and prednisone. The patient finished a regimen on 02/19/2014. The patient had a mechanical fall on the evening prior to admission. When EMS arrived, there was concern for an abnormal EKG and STEMI was activated. This was ultimately canceled when reviewed by the ER physician. The patient was hypoxemic and placed on BiPAP the emergency department. She gradually improved and was weaned off the BiPAP. The patient continues to complain of shortness of breath, worse than usual without any chest discomfort, nausea, vomiting. She has had some subjective fevers and chills. The patient continues to smoke 5-6 cigarettes per day. Assessment/Plan: C Diff Colitis  -Patient having a history of Cdiff colitis, receiving antibiotic therapy for COPD exacerbation -Now having stool positive for Cdiff, having multiple bowel movements today -Since this represents recurrence will treat with oral vancomycin           Acute on chronic respiratory failure -Secondary to COPD exacerbation -Restart intravenous Solu-Medrol -Aerosolized albuterol and Atrovent COPD exacerbation -Transitioned to oral prednisone -Aerosolized albuterol and Atrovent -Pulmonary hygiene -Stopped antimicrobial therapy  Orthostatic hypotension -The patient's orthostatic vitals were positive by pulse -Start intravenous fluids -Daughter relates decreased by mouth intake for the past week -Discontinue  torsemide -Discontinue potassium supplementation Hypertension -continue diltiazem as the patient's blood pressure is soft -controlled Tobacco use -Tobacco cessation discussed -Patient has little desire to quit -Nicoderm patch Deconditioning -Physical therapy evaluation   Family Communication: Spoke with her daughter at bedside Disposition Plan:   Home when medically stable, patient lives alone  Antibiotics:  Levofloxacin  02/21/14>>>02/23/2014   Procedures/Studies: Ct Head Wo Contrast  02/21/2014   CLINICAL DATA:  Generalized body aches.  Fall.  Code STEMI  EXAM: CT HEAD WITHOUT CONTRAST  TECHNIQUE: Contiguous axial images were obtained from the base of the skull through the vertex without intravenous contrast.  COMPARISON:  None.  FINDINGS: Mild diffuse cerebral atrophy. Mild ventricular dilatation consistent with central atrophy. Diffuse low-attenuation changes throughout the deep white matter consistent with small vessel ischemia. No mass effect or midline shift. No abnormal extra-axial fluid collections. Gray-white matter junctions are distinct. Basal cisterns are not effaced. No evidence of acute intracranial hemorrhage. No depressed skull fractures. Visualized paranasal sinuses and mastoid air cells are not opacified.  IMPRESSION: No acute intracranial abnormalities. Chronic atrophy and small vessel ischemic changes.   Electronically Signed   By: Burman Nieves M.D.   On: 02/21/2014 01:24   Dg Chest Portable 1 View  02/20/2014   CLINICAL DATA:  Code STEMI, shortness of breath.  EXAM: PORTABLE CHEST - 1 VIEW  COMPARISON:  Chest radiograph February 16, 2014  FINDINGS: The cardiac silhouette appears mildly enlarged, similar. Calcified aortic knob, mediastinal silhouette is nonsuspicious. No pleural effusions. Possible left upper lobe patchy airspace opacity though, patient is rotated to the left and, facial structures partially obscure the left lung apex. Increased lung volumes with similar  chronic interstitial changes. No pneumothorax.  Osteopenia. Multiple EKG lines overlie the patient and  may obscure subtle underlying pathology.  IMPRESSION: Mild cardiomegaly and COPD. Possible left upper lobe airspace opacity, which could reflect confluence of shadows, consider follow-up PA and lateral views the chest when clinically able.   Electronically Signed   By: Awilda Metroourtnay  Bloomer   On: 02/20/2014 23:46       Subjective: Patient having multiple bowel movements this morning stool for Cdiff coming back positive yesterday.   Objective: Filed Vitals:   02/22/14 2221 02/23/14 0649 02/23/14 0915 02/23/14 1425  BP: 109/46 114/67 136/64   Pulse: 88 72 87 75  Temp: 98.1 F (36.7 C) 98.1 F (36.7 C)    TempSrc: Oral Oral    Resp: 18 16  16   Height:      Weight:  35.517 kg (78 lb 4.8 oz)    SpO2: 98% 100%  99%    Intake/Output Summary (Last 24 hours) at 02/23/14 1521 Last data filed at 02/23/14 0900  Gross per 24 hour  Intake    960 ml  Output      0 ml  Net    960 ml   Weight change: 0.635 kg (1 lb 6.4 oz) Exam:   General:  Pt is alert, follows commands appropriately, not in acute distress  HEENT: No icterus, No thrush,  [place]/AT  Cardiovascular: RRR, S1/S2, no rubs, no gallops  Respiratory: Bilateral expiratory wheezes. Becoming dyspneic on ambulation.  Abdomen: Soft/+BS, non tender, non distended, no guarding  Extremities: trace LE edema, No lymphangitis, No petechiae, No rashes, no synovitis  Data Reviewed: Basic Metabolic Panel:  Recent Labs Lab 02/20/14 2255 02/20/14 2303 02/21/14 0335 02/22/14 0531 02/23/14 0455  NA 141 136* 140 143 141  K 4.3 4.1 4.2 4.0 4.3  CL 89* 86* 88* 93* 92*  CO2 44*  --  45* 44* 41*  GLUCOSE 130* 132* 307* 197* 127*  BUN 52* 51* 48* 34* 29*  CREATININE 0.98 1.30* 0.96 0.69 0.67  CALCIUM 9.4  --  8.8 9.2 9.2   Liver Function Tests:  Recent Labs Lab 02/20/14 2255 02/21/14 0335  AST 19 15  ALT 19 15  ALKPHOS 82 72    BILITOT 0.2* <0.2*  PROT 7.1 5.9*  ALBUMIN 3.7 3.1*   No results found for this basename: LIPASE, AMYLASE,  in the last 168 hours No results found for this basename: AMMONIA,  in the last 168 hours CBC:  Recent Labs Lab 02/20/14 2255 02/20/14 2303 02/21/14 0335  WBC 7.7  --  7.6  NEUTROABS  --   --  7.5  HGB 14.0 16.3* 13.0  HCT 45.0 48.0* 42.9  MCV 90.5  --  92.1  PLT 192  --  172   Cardiac Enzymes:  Recent Labs Lab 02/21/14 0335  TROPONINI <0.30   BNP: No components found with this basename: POCBNP,  CBG: No results found for this basename: GLUCAP,  in the last 168 hours  Recent Results (from the past 240 hour(s))  URINE CULTURE     Status: None   Collection Time    02/21/14  9:04 PM      Result Value Ref Range Status   Specimen Description URINE, CLEAN CATCH   Final   Special Requests NONE   Final   Culture  Setup Time     Final   Value: 02/22/2014 01:47     [facility]formed at [facility]   [place] Count     Final   Value: NO GROWTH     [facility]   Culture     Final   Value: NO GROWTH     Performed at [facility]   Report Status 02/23/2014 FINAL   Final  CLOSTRIDIUM DIFFICILE BY PCR     Status: Abnormal   Collection Time    02/22/14  1:05 PM      Result Value Ref Range Status   C difficile by pcr POSITIVE (*) NEGATIVE Final   Comment: CRITICAL RESULT CALLED TO, READ BACK BY AND VERIFIED WITH:     U. HABIB RN 15:00 02/22/14 ([email or web address])     Scheduled Meds: . budesonide (PULMICORT) nebulizer solution  0.25 mg Nebulization BID  . diltiazem  120 mg Oral Daily  . enoxaparin (LOVENOX) injection  15 mg Subcutaneous Q24H  . feeding supplement (ENSURE COMPLETE)  237 mL Oral TID BM  . guaiFENesin  600 mg Oral BID  . ipratropium-albuterol  3 mL Nebulization TID  . predniSONE  60 mg Oral Q breakfast  . sodium chloride  3 mL Intravenous Q12H  . vancomycin  125 mg Oral 4 times per day   Continuous Infusions:    Jeralyn Bennett, DO  [facility] Pager [phone]  If 7PM-7AM, please contact night-coverage [email or web address] Password [email or web address] 02/23/2014, 3:21 PM   LOS: 3 days

## 2014-02-23 NOTE — Progress Notes (Signed)
Physical Therapy Treatment Patient Details Name: Mindy Daniels MRN: [id number] DOB: 02/27/1941 Today's Date: 02/23/2014    History of Present Illness Admitted after having a fall at home and then possible MI which was then canceled.  Pt has hx of COPD and is on home o2.    PT Comments    Pt progressing towards physical therapy goals. Treatment session overall limited to pt's willingness to participate in interventions outside of gait training. Pt educated on benefits of pursed-lip breathing and when to utilize this breathing technique. Will continue to follow and progress as able per POC.   Follow Up Recommendations  Home health PT     Equipment Recommendations  None recommended by PT    Recommendations for Other Services       Precautions / Restrictions Precautions Precautions: Fall Precaution Comments: 2 falls in last 6 months Restrictions Weight Bearing Restrictions: No    Mobility  Bed Mobility Overal bed mobility: Modified Independent             General bed mobility comments: HOB elevated, but pt reports she sleeps on sofa with pillows propping her up.  Transfers Overall transfer level: Modified independent Equipment used: Rolling walker (2 wheeled)             General transfer comment: Is able to transition to standing without an AD. Reaches for the RW after has been standing independently for a few seconds.   Ambulation/Gait Ambulation/Gait assistance: Supervision Ambulation Distance (Feet): 100 Feet Assistive device: Rolling walker (2 wheeled) Gait Pattern/deviations: Step-through pattern;Decreased stride length;Trunk flexed Gait velocity: Decreased Gait velocity interpretation: Below normal speed for age/gender General Gait Details: VC's for walker placement closer to pt's body. Appeared to have difficulty managing walker direction. VC's for general safety awareness and walker direction - pt does not seem aware that she is about to run into people  in the hallways. When pointed out to her, pt laughs and states "I am a bad driver I guess."   Stairs            Wheelchair Mobility    Modified Rankin (Stroke Patients Only)       Balance Overall balance assessment: Needs assistance Sitting-balance support: Feet supported;No upper extremity supported Sitting balance-Leahy Scale: Good     Standing balance support: No upper extremity supported Standing balance-Leahy Scale: Fair                      Cognition Arousal/Alertness: Awake/alert Behavior During Therapy: WFL for tasks assessed/performed Overall Cognitive Status: No family/caregiver present to determine baseline cognitive functioning                      Exercises      General Comments General comments (skin integrity, edema, etc.): Pt declining any other mobility at this time, including therapeutic exercise      Pertinent Vitals/Pain Vitals stable throughout session. No SOB noted, and O2 sats remained ~87% throughout session at rest and during ambulation.     Home Living Family/patient expects to be discharged to:: Private residence                    Prior Function            PT Goals (current goals can now be found in the care plan section) Acute Rehab PT Goals Patient Stated Goal: go home ASAP PT Goal Formulation: With patient Time For Goal Achievement: 02/28/14 Potential to Achieve Goals:  Good Progress towards PT goals: Progressing toward goals    Frequency  Min 3X/week    PT Plan Current plan remains appropriate    Co-evaluation             End of Session Equipment Utilized During Treatment: Gait belt;Oxygen Activity Tolerance: Patient limited by fatigue Patient left: in bed;with bed alarm set;with call bell/phone within reach     Time: [id number] PT Time Calculation (min): 31 min  Charges:  $Gait Training: 8-22 mins $Therapeutic Activity: 8-22 mins                    G Codes:      Ruthann CancerHamilton,  Latissa Frick 02/23/2014, 4:40 PM  Ruthann CancerLaura Hamilton, PT, DPT Acute Rehabilitation Services Pager: [phone]

## 2014-02-23 NOTE — Progress Notes (Signed)
Pt is resting in bed, no complaints, and or signs of distress. States breathing is better. Report given to oncoming RN.

## 2014-02-24 ENCOUNTER — Ambulatory Visit (HOSPITAL_COMMUNITY): Admission: RE | Admit: 2014-02-24 | Payer: Medicare HMO | Source: Ambulatory Visit | Admitting: Cardiology

## 2014-02-24 DIAGNOSIS — A0472 Enterocolitis due to Clostridium difficile, not specified as recurrent: Secondary | ICD-10-CM

## 2014-02-24 LAB — BASIC METABOLIC PANEL
ANION GAP: 8 (ref 5–15)
BUN: 27 mg/dL — ABNORMAL HIGH (ref 6–23)
CO2: 37 mEq/L — ABNORMAL HIGH (ref 19–32)
CREATININE: 0.6 mg/dL (ref 0.50–1.10)
Calcium: 9 mg/dL (ref 8.4–10.5)
Chloride: 91 mEq/L — ABNORMAL LOW (ref 96–112)
GFR calc Af Amer: >90 mL/min (ref >90)
GFR calc non Af Amer: 88 mL/min — ABNORMAL LOW (ref >90)
GLUCOSE: 76 mg/dL (ref 70–99)
Potassium: 4.5 mEq/L (ref 3.7–5.3)
Sodium: 136 mEq/L — ABNORMAL LOW (ref 137–147)

## 2014-02-24 LAB — CBC
HCT: 40.2 % (ref 36.0–46.0)
Hemoglobin: 12.4 g/dL (ref 12.0–15.0)
MCH: 27.1 pg (ref 26.0–34.0)
MCHC: 30.8 g/dL (ref 30.0–36.0)
MCV: 87.8 fL (ref 78.0–100.0)
PLATELETS: 177 10*3/uL (ref 150–400)
RBC: 4.58 MIL/uL (ref 3.87–5.11)
RDW: 14.3 % (ref 11.5–15.5)
WBC: 11.2 10*3/uL — ABNORMAL HIGH (ref 4.0–10.5)

## 2014-02-24 MED ORDER — VANCOMYCIN 50 MG/ML ORAL SOLUTION: 125.0000 mg | Freq: Four times a day (QID) | ORAL | Status: AC

## 2014-02-24 MED ORDER — GUAIFENESIN ER 600 MG PO TB12: 600.0000 mg | ORAL_TABLET | Freq: Two times a day (BID) | ORAL | Status: AC

## 2014-02-24 NOTE — Progress Notes (Signed)
Co-signed for Irfat Habib RN/BSN for medication administration, assessments, Is&Os, Vital Signs, Progress Notes, Care Plans, and Patient education. Andreana Klingerman M RN/BSN 

## 2014-02-24 NOTE — Discharge Summary (Signed)
Physician Discharge Summary  MEILY GLOWACKI ZOX:[id number] DOB: 08/10/1940 DOA: 02/20/2014  PCP: Toy Cookey, MD  Admit date: 02/20/2014 Discharge date: 02/24/2014  Time spent: 35 minutes  Recommendations for Outpatient Follow-up:  1. Please obtain a CT Scan of Lungs with IV contrast on hospital follow up. A CXR showed a 2.6 cm density in the left lung. I recommended to her and her daughter that she have the CT prior to discharge, however they did not wish to stay for this procedure and reassured me that they would follow up with her PCP Dr. Maryellen Pile. I called Dr Maryellen Pile and made him aware of these CXR findings and will order the outpatient CT.   Discharge Diagnoses:  Principal Problem:   COPD exacerbation Active Problems:   Acute on chronic respiratory failure   Enteritis due to Clostridium difficile   Near syncope   HTN (hypertension)   Orthostatic hypotension   Protein-calorie malnutrition, severe   Discharge Condition: Stable/Improved  Diet recommendation: Heart Healthy  [facility] Weights   02/22/14 0605 02/23/14 0649 02/24/14 0503  Weight: 34.882 kg (76 lb 14.4 oz) 35.517 kg (78 lb 4.8 oz) 36.106 kg (79 lb 9.6 oz)    History of present illness:  Mindy Daniels is a 73 y.o. female with history of COPD on home oxygen and ongoing tobacco abuse, hypertension was brought to the ER after patient had a fall at her house. Patient states that she fell over her coffee table. Patient states that she is not able to explain exactly what happened but she suddenly lost balance and fell and was unable to get up and had to call her neighbor. Her neighbor called the EMS and when EMS reached the house patient was complaining of generalized pain and EKG done was showing nonspecific ST-T changes in V3 and V4 was initially brought in as code STEMI. But was canceled later. On arrival patient was found to be wheezing and short of breath and was initially placed on BiPAP and so was discontinued after patient  breathing room nebulizer. Patient recently was taken to the urgent care Center 2 days ago for hypoxia and was placed on Zithromax for bronchitis along with prednisone which patient states she had taken the 2 day course prescribed. Patient denies any nausea vomiting abdominal pain diarrhea fever chills headache visual symptoms. On exam patient is nonfocal. Patient has generalized body ache and upper back pain which has been chronic. Patient will be admitted for further observation. On exam patient presently is not wheezing. Chest x-ray shows nonspecific opacity in the left upper lobe.    Hospital Course:  Acute on chronic respiratory failure  -Secondary to COPD exacerbation  -Continued intravenous Solu-Medrol while in hospital  -Aerosolized albuterol and Atrovent  -Patient has been weaned back to her usual 2.5 L  -Home with prednisone 60 mg daily, decrease by 10 mg daily  -levofloxacin 250mg  daily x 4 days to complete 7 days of therapy  -D-dimer was neg  COPD exacerbation  -Restart steroids  -Aerosolized albuterol and Atrovent  -Pulmonary hygiene  -Patient has been weaned back to her usual 2.5 L  -Home with prednisone 60 mg daily, decrease by 10 mg daily  -levofloxacin 250mg  daily x 4 daily to complete 7 days of therapy  2.6 cm Left Lung Opacity -I explained to patient and her daughter who was present at bedside CXR findings of 2.6 cm opacity reported on CXR -I recommended that this be further worked up with a CT scan of lungs with  IV contrast, as I am concerned with her history of tobacco abuse.Unfortunately they did not wish to stay for this procedure and reassured me that they would follow up with Dr Maryellen PileEason her PCP. -I called Dr Maryellen PileEason and made him aware of CXR findings and will order the outpatient CT  Orthostatic hypotension  -The patient's orthostatic vitals were positive by pulse  -Started intravenous fluids  -Daughter relates decreased by mouth intake for the past week  -Discontinue  torsemide  -Discontinue potassium supplementation  -Will not restart torsemide at the time of discharge given orthostatic changes involving depletion  -Patient has no peripheral edema on examination--followup with primary care provider to determine if the patient is to restart torsemide in the future  Cdiff Colitis  -Will undergo 2 weeks treatment with oral Vancomycin 125 mg PO q 6 hours Hyperglycemia  -due to steroids  -HbA1c  Hypertension  -continue diltiazem  -controlled  Tobacco use  -Tobacco cessation discussed  -Patient has little desire to quit  -Nicoderm patch  Deconditioning  -Physical therapy--home health PT   Consultations:  Physical Therapy  Social work  Discharge Exam: Filed Vitals:   02/24/14 1038  BP: 118/45  Pulse: 72  Temp:   Resp:    General: A&O x 3, NAD, pleasant, cooperative  Cardiovascular: RRR, no rub, no gallop, no S3  Respiratory: Bibasilar rales. Minimal bibasilar wheezes. Good air movement  Abdomen:soft, nontender, nondistended, positive bowel sounds  Extremities: No edema, No lymphangitis, no petechiae   Discharge Instructions You were cared for by a hospitalist during your hospital stay. If you have any questions about your discharge medications or the care you received while you were in the hospital after you are discharged, you can call the unit and asked to speak with the hospitalist on call if the hospitalist that took care of you is not available. Once you are discharged, your primary care physician will handle any further medical issues. Please note that NO REFILLS for any discharge medications will be authorized once you are discharged, as it is imperative that you return to your primary care physician (or establish a relationship with a primary care physician if you do not have one) for your aftercare needs so that they can reassess your need for medications and monitor your lab values.      Discharge Instructions   Call MD for:   difficulty breathing, headache or visual disturbances    Complete by:  As directed      Call MD for:  extreme fatigue    Complete by:  As directed      Call MD for:  persistant dizziness or light-headedness    Complete by:  As directed      Call MD for:  persistant nausea and vomiting    Complete by:  As directed      Call MD for:  redness, tenderness, or signs of infection (pain, swelling, redness, odor or green/yellow discharge around incision site)    Complete by:  As directed      Call MD for:  severe uncontrolled pain    Complete by:  As directed      Call MD for:  temperature >100.4    Complete by:  As directed      Diet - low sodium heart healthy    Complete by:  As directed      Increase activity slowly    Complete by:  As directed  Medication List    STOP taking these medications       azithromycin 500 MG tablet  Commonly known as:  ZITHROMAX     potassium chloride SA 20 MEQ tablet  Commonly known as:  K-DUR,KLOR-CON     torsemide 10 MG tablet  Commonly known as:  DEMADEX      TAKE these medications       budesonide-formoterol 160-4.5 MCG/ACT inhaler  Commonly known as:  SYMBICORT  Inhale 2 puffs into the lungs 2 (two) times daily.     diltiazem 120 MG 24 hr capsule  Commonly known as:  DILACOR XR  Take 120 mg by mouth daily.     guaiFENesin 600 MG 12 hr tablet  Commonly known as:  MUCINEX  Take 1 tablet (600 mg total) by mouth 2 (two) times daily.     ipratropium 17 MCG/ACT inhaler  Commonly known as:  ATROVENT HFA  Inhale 2 puffs into the lungs every 6 (six) hours as needed for wheezing.     ipratropium-albuterol 0.5-2.5 (3) MG/3ML Soln  Commonly known as:  DUONEB  Take 3 mLs by nebulization every 5 (five) hours as needed (shortness of breath).     predniSONE 10 MG tablet  Commonly known as:  DELTASONE  Take 6 tablets (60 mg total) by mouth daily with breakfast. Start 02/24/14 and decrease by 10mg  daily     traZODone 50 MG tablet   Commonly known as:  DESYREL  Take 50 mg by mouth at bedtime as needed for sleep.     vancomycin 50 mg/mL oral solution  Commonly known as:  VANCOCIN  Take 2.5 mLs (125 mg total) by mouth every 6 (six) hours. Take for 2 weeks       No Known Allergies Follow-up Information   Follow up with [facility]. (RN/NA/PT)    Specialty:  Home Health Services   Contact information:   [address] [place] [address] [phone]       Follow up with EASON,ERNEST, MD In 1 week.   Specialty:  Internal Medicine   Contact information:   [address], [address] [place] [address] [phone]        The results of significant diagnostics from this hospitalization (including imaging, microbiology, ancillary and laboratory) are listed below for reference.    Significant Diagnostic Studies: Ct Head Wo Contrast  02/21/2014   CLINICAL DATA:  Generalized body aches.  Fall.  Code STEMI  EXAM: CT HEAD WITHOUT CONTRAST  TECHNIQUE: Contiguous axial images were obtained from the base of the skull through the vertex without intravenous contrast.  COMPARISON:  None.  FINDINGS: Mild diffuse cerebral atrophy. Mild ventricular dilatation consistent with central atrophy. Diffuse low-attenuation changes throughout the deep white matter consistent with small vessel ischemia. No mass effect or midline shift. No abnormal extra-axial fluid collections. Gray-white matter junctions are distinct. Basal cisterns are not effaced. No evidence of acute intracranial hemorrhage. No depressed skull fractures. Visualized paranasal sinuses and mastoid air cells are not opacified.  IMPRESSION: No acute intracranial abnormalities. Chronic atrophy and small vessel ischemic changes.   Electronically Signed   By: Burman Nieves M.D.   On: 02/21/2014 01:24   Dg Chest Port 1 View  02/23/2014   CLINICAL DATA:  Worsening productive cough.  EXAM: PORTABLE CHEST - 1 VIEW  COMPARISON:  02/20/2014.  02/16/2014.   04/05/2013.  FINDINGS: There is chronic left ventricular prominence. The aorta shows calcification and tortuosity. Lungs are hyperinflated with prominent interstitial  markings consistent with diffuse emphysema. There is chronic scarring at both lung bases. One could not rule out mild active atelectasis or infiltrate at the lung bases, but the findings may simply be scarring related to previous basilar pneumonia. I question a 2.6 cm focal density in the left upper lobe that could be a developing mass lesion. Chest CT is recommended. No effusions. No acute bony findings.  IMPRESSION: Background pattern of emphysema. Question 2.6 cm density in the left upper lobe that could be a developing mass lesion. Chest CT is recommended.  Probable chronic scarring at both lung bases. Cannot rule out mild basilar pneumonia or volume loss.   Electronically Signed   By: Paulina Fusi M.D.   On: 02/23/2014 07:53   Dg Chest Portable 1 View  02/20/2014   CLINICAL DATA:  Code STEMI, shortness of breath.  EXAM: PORTABLE CHEST - 1 VIEW  COMPARISON:  Chest radiograph February 16, 2014  FINDINGS: The cardiac silhouette appears mildly enlarged, similar. Calcified aortic knob, mediastinal silhouette is nonsuspicious. No pleural effusions. Possible left upper lobe patchy airspace opacity though, patient is rotated to the left and, facial structures partially obscure the left lung apex. Increased lung volumes with similar chronic interstitial changes. No pneumothorax.  Osteopenia. Multiple EKG lines overlie the patient and may obscure subtle underlying pathology.  IMPRESSION: Mild cardiomegaly and COPD. Possible left upper lobe airspace opacity, which could reflect confluence of shadows, consider follow-up PA and lateral views the chest when clinically able.   Electronically Signed   By: Awilda Metro   On: 02/20/2014 23:46    Microbiology: Recent Results (from the past 240 hour(s))  URINE CULTURE     Status: None   Collection Time     02/21/14  9:04 PM      Result Value Ref Range Status   Specimen Description URINE, CLEAN CATCH   Final   Special Requests NONE   Final   Culture  Setup Time     Final   Value: 02/22/2014 01:47     Performed at [facility] Count     Final   Value: NO GROWTH     Performed at [facility]   Culture     Final   Value: NO GROWTH     Performed at [facility]   Report Status 02/23/2014 FINAL   Final  CLOSTRIDIUM DIFFICILE BY PCR     Status: Abnormal   Collection Time    02/22/14  1:05 PM      Result Value Ref Range Status   C difficile by pcr POSITIVE (*) NEGATIVE Final   Comment: CRITICAL RESULT CALLED TO, READ BACK BY AND VERIFIED WITH:     U. HABIB RN 15:00 02/22/14 ([email or web address])     Labs: Basic Metabolic Panel:  Recent Labs Lab 02/20/14 2255 02/20/14 2303 02/21/14 0335 02/22/14 0531 02/23/14 0455 02/24/14 0530  NA 141 136* 140 143 141 136*  K 4.3 4.1 4.2 4.0 4.3 4.5  CL 89* 86* 88* 93* 92* 91*  CO2 44*  --  45* 44* 41* 37*  GLUCOSE 130* 132* 307* 197* 127* 76  BUN 52* 51* 48* 34* 29* 27*  CREATININE 0.98 1.30* 0.96 0.69 0.67 0.60  CALCIUM 9.4  --  8.8 9.2 9.2 9.0   Liver Function Tests:  Recent Labs Lab 02/20/14 2255 02/21/14 0335  AST 19 15  ALT 19 15  ALKPHOS 82 72  BILITOT 0.2* <0.2*  PROT 7.1 5.9*  ALBUMIN 3.7 3.1*   No results found for this basename: LIPASE, AMYLASE,  in the last 168 hours No results found for this basename: AMMONIA,  in the last 168 hours CBC:  Recent Labs Lab 02/20/14 2255 02/20/14 2303 02/21/14 0335 02/24/14 0530  WBC 7.7  --  7.6 11.2*  NEUTROABS  --   --  7.5  --   HGB 14.0 16.3* 13.0 12.4  HCT 45.0 48.0* 42.9 40.2  MCV 90.5  --  92.1 87.8  PLT 192  --  172 177   Cardiac Enzymes:  Recent Labs Lab 02/21/14 0335  TROPONINI <0.30   BNP: BNP (last 3 results)  Recent Labs  02/20/14 2255  PROBNP 216.7*   CBG: No results found for this basename: GLUCAP,  in the last 168  hours     Signed:  Jeralyn Bennett  [facility] 02/24/2014, 10:56 AM

## 2014-02-24 NOTE — Progress Notes (Signed)
Pt discharged. IV Dc'd, home O2 used for transport. Pt left via wheelchair. No complaints, and or signs and symptoms of distress. Pt went home, assisted by daughter.

## 2014-08-16 IMAGING — CT CT HEAD W/O CM
1 of 2 series · 14 of 30 positions shown, 18 images · non-contrast
Comparison: None.

CLINICAL DATA: Generalized body aches.  Fall.  Code STEMI

EXAM:
CT HEAD WITHOUT CONTRAST
TECHNIQUE: Contiguous axial images were obtained from the base of the skull
through the vertex without intravenous contrast.

[Series 2: head 5.0 h30s · axial · 0.44mm/px · z∈[-147,-22]mm · 14 of 29 slices shown, 18 images]
[im 2/29  brain]
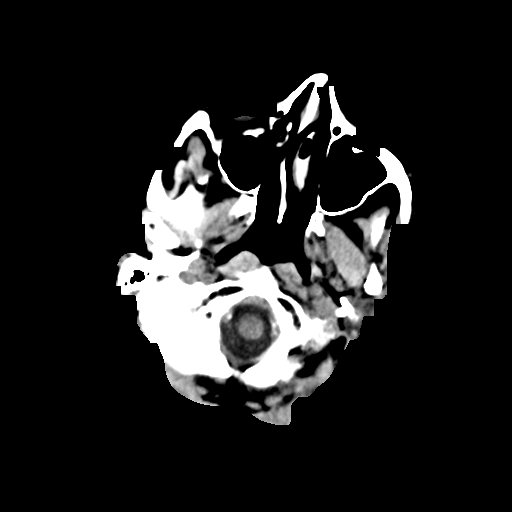
[im 2/29  bone]
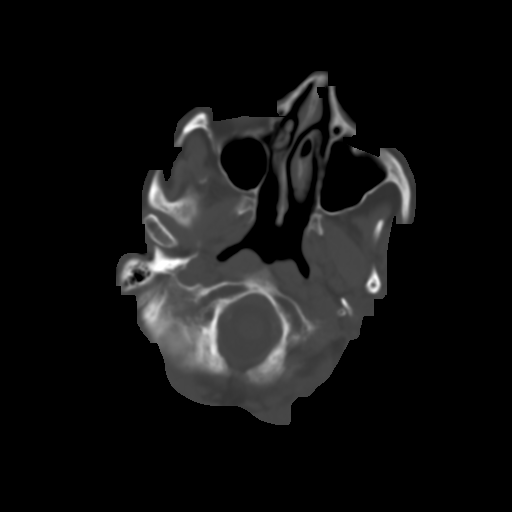
[im 4/29  brain]
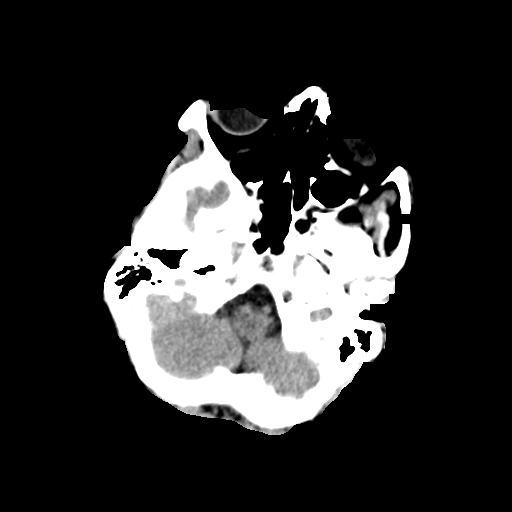
[im 6/29  brain]
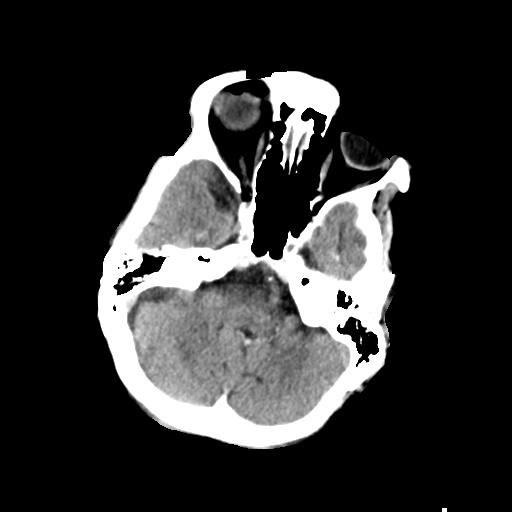
[im 8/29  brain]
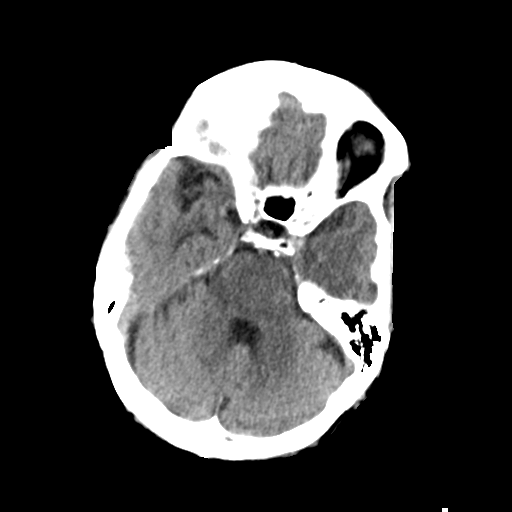
[im 10/29  brain]
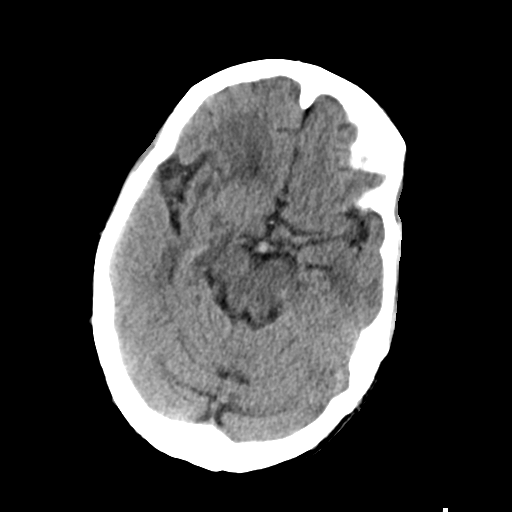
[im 10/29  bone]
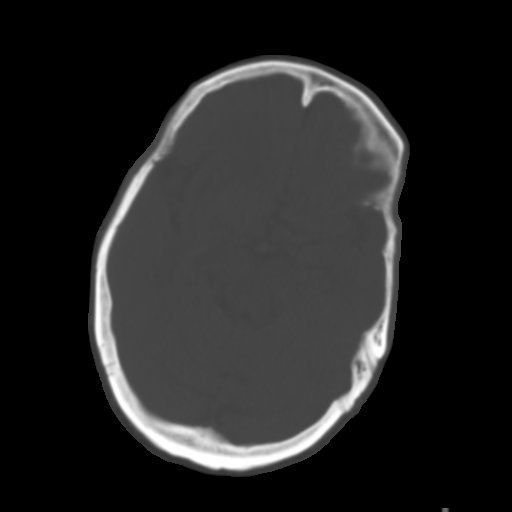
[im 12/29  brain]
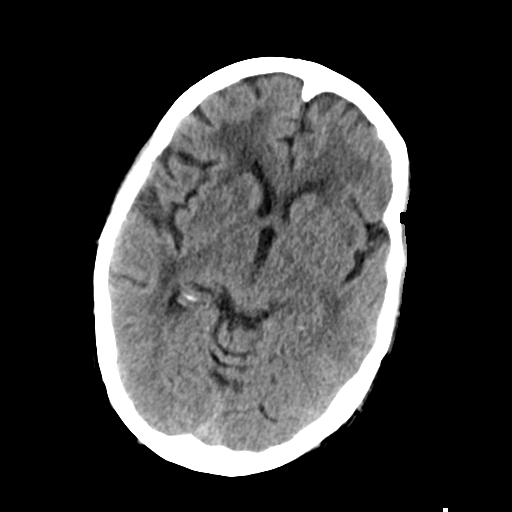
[im 14/29  brain]
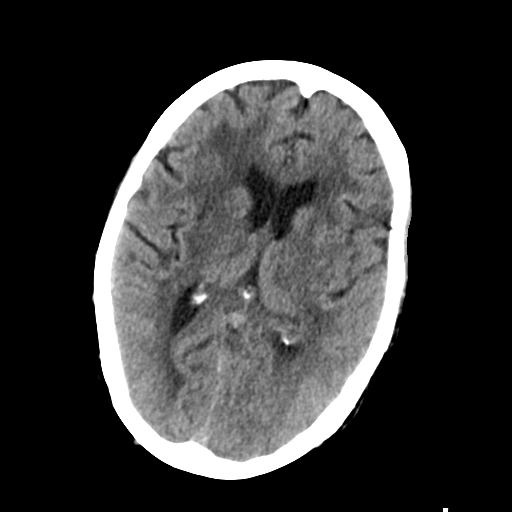
[im 15/29  brain]
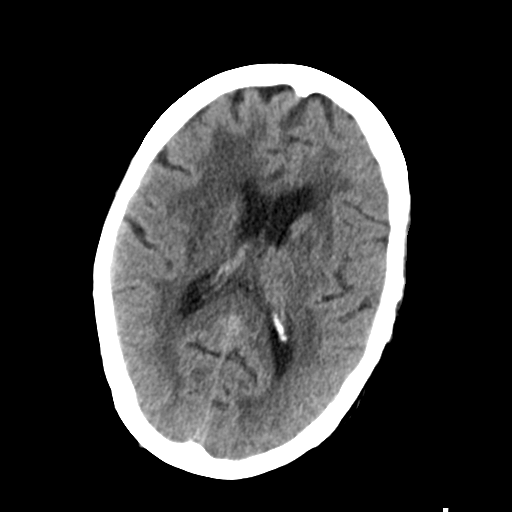
[im 17/29  brain]
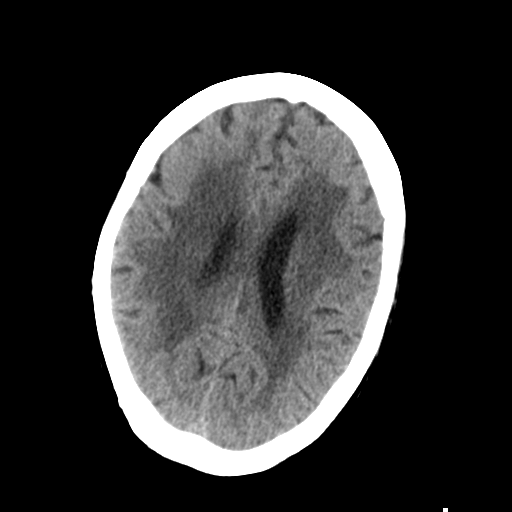
[im 17/29  bone]
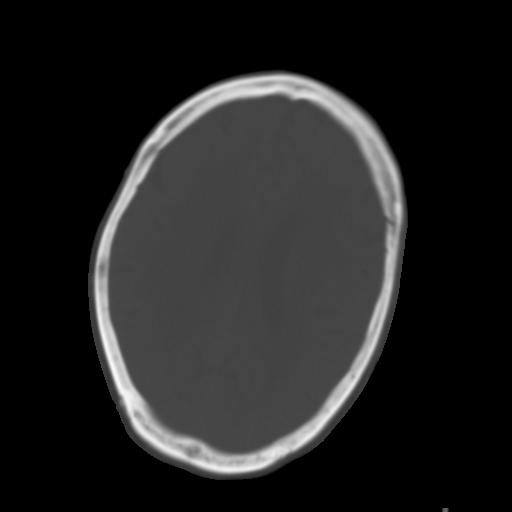
[im 19/29  brain]
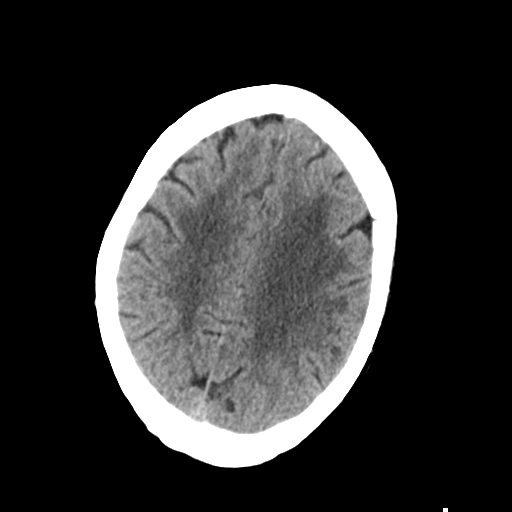
[im 21/29  brain]
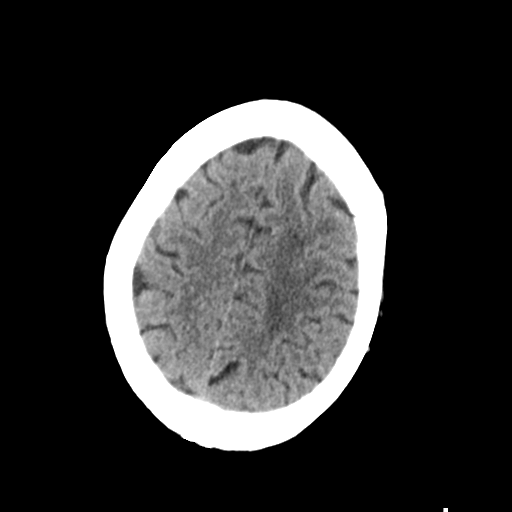
[im 23/29  brain]
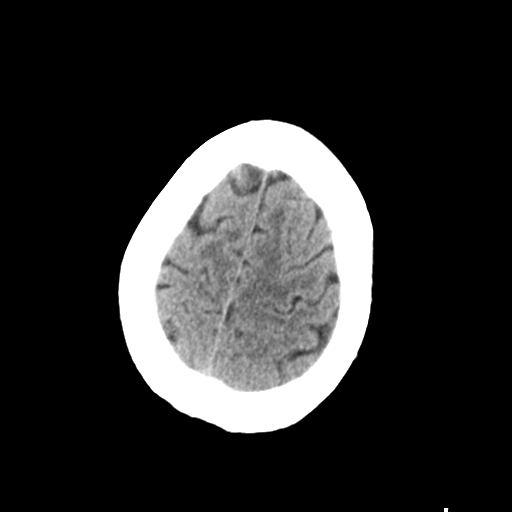
[im 25/29  brain]
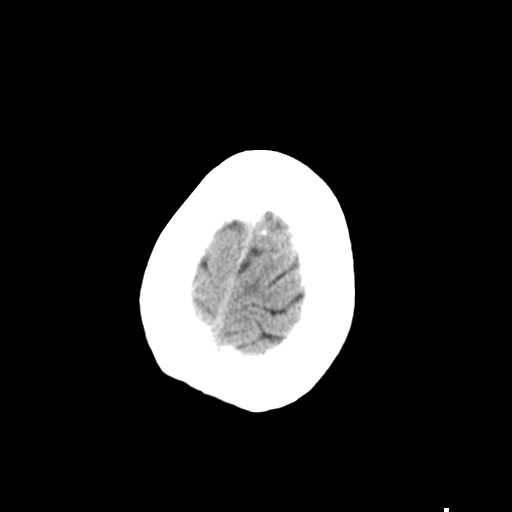
[im 25/29  bone]
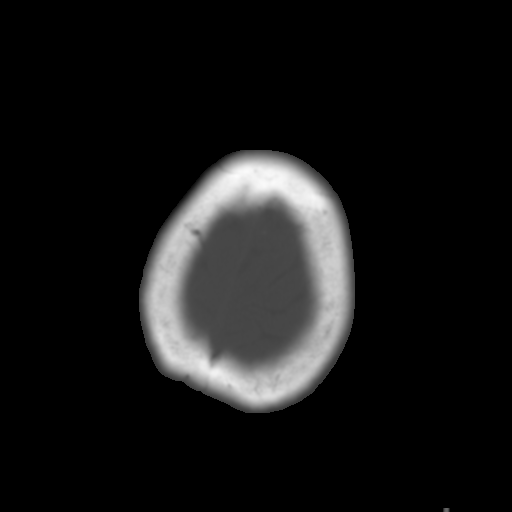
[im 27/29  brain]
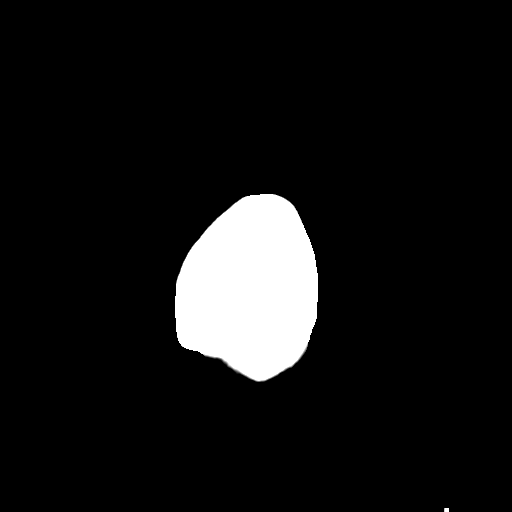

[14 of 30 positions shown; findings below may reference images not displayed]

FINDINGS: Mild diffuse cerebral atrophy. Mild ventricular dilatation
consistent with central atrophy. Diffuse low-attenuation changes
throughout the deep white matter consistent with small vessel
ischemia. No mass effect or midline shift. No abnormal extra-axial
fluid collections. Gray-white matter junctions are distinct. Basal
cisterns are not effaced. No evidence of acute intracranial
hemorrhage. No depressed skull fractures. Visualized paranasal
sinuses and mastoid air cells are not opacified.
IMPRESSION: No acute intracranial abnormalities. Chronic atrophy and small
vessel ischemic changes.

## 2014-08-18 IMAGING — CR DG CHEST 1V PORT
1 series · 1 of 1 positions shown · non-contrast
Comparison: 02/20/2014.  02/16/2014.  04/05/2013.

CLINICAL DATA: Worsening productive cough.

EXAM:
PORTABLE CHEST - 1 VIEW

[AP]
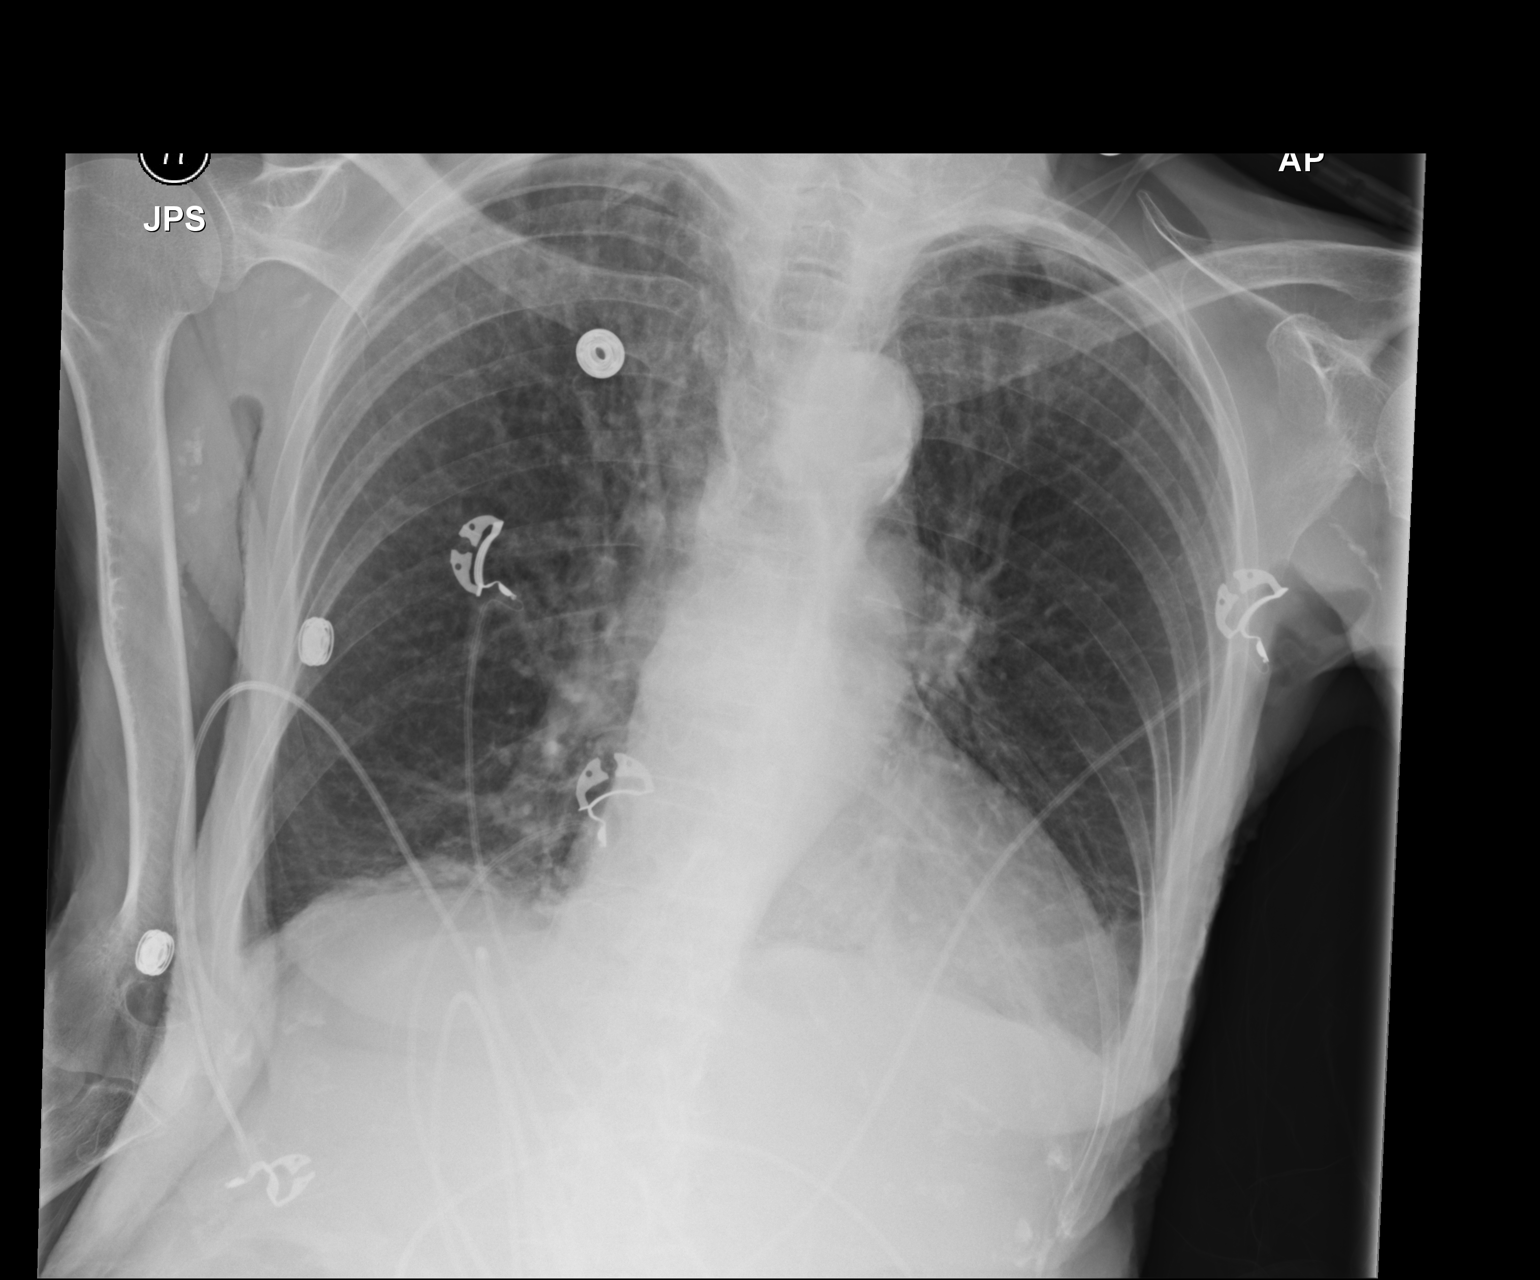

[1 of 1 positions shown; findings below may reference images not displayed]

FINDINGS: There is chronic left ventricular prominence. The aorta shows
calcification and tortuosity. Lungs are hyperinflated with prominent
interstitial markings consistent with diffuse emphysema. There is
chronic scarring at both lung bases. One could not rule out mild
active atelectasis or infiltrate at the lung bases, but the findings
may simply be scarring related to previous basilar pneumonia. I
question a 2.6 cm focal density in the left upper lobe that could be
a developing mass lesion. Chest CT is recommended. No effusions. No
acute bony findings.
IMPRESSION: Background pattern of emphysema. Question 2.6 cm density in the left
upper lobe that could be a developing mass lesion. Chest CT is
recommended.

Probable chronic scarring at both lung bases. Cannot rule out mild
basilar pneumonia or volume loss.

## 2014-11-18 NOTE — Discharge Summary (Signed)
PATIENT NAME:  Mindy Daniels, Mindy Daniels MR#:  [id number] DATE OF BIRTH:  1941-07-26  DATE OF ADMISSION:  03/29/2013 DATE OF DISCHARGE:  04/06/2013  DISPOSITION: [facility].  DISCHARGE DIAGNOSIS:  1.  Acute on chronic respiratory failure secondary to pneumonia and chronic obstructive pulmonary disease flare.  2.  Bilateral hip pain and back pain secondary to arthritis. 3.  Tobacco abuse, persistent.  DISCHARGE MEDICATIONS: 1.  Atrovent 2 puffs 4 times daily. 2.  Advair Diskus 250/50 one puff b.i.d.  3.  Percocet 5/325 mg 1 tablet p.o. b.i.d. as needed for pain.  4.  Cardizem 30 mg every 6 hours.  5.  Spiriva 18 mcg inhalation daily.  6.  Aldactone 25 mg p.o. daily.  7.  Prednisone 20 mg 2 tablets daily for 3 days then 1 tablet daily for 3 days and then stop.  8.  Levaquin 500 mg p.o. daily for 3 days.  OXYGEN: Two liters via nasal cannula.  CONSULTANTS: Pulmonary with Dr. Meredeth IdeFleming.   HOSPITAL COURSE: The patient was admitted on 09/01 by Dr. Randol KernElgergawy because of weakness with fall and trouble breathing. The patient is a 74 year old female with history of severe COPD, hypertension and pulmonary fibrosis who came in because of shortness of breath and also history of fall. The patient had low back pain and bilateral hip pain. The patient has been following up with Dr. Rosita KeaMenz for several compression fractures. 1.  Acute on chronic respiratory failure due to COPD exacerbation. O2 sats were 77% on room air. She uses 2.5 liters of oxygen. Admitted to the hospitalist service, started on Solu-Medrol. She was continued on nebulizers and Spiriva. The patient had a white count of 18,000. Chest x-ray showed COPD with left lung pneumonia. She was given Rocephin and Zithromax also on admission. The patient initially did well and her O2 sat dropped with severe cough. The patient was seen by Dr. Belia HemanKasa who is covering for Dr. Meredeth IdeFleming who ordered a CAT scan of the chest. The patient's CT chest showed extensive  emphysematous changes and bilateral lung infiltrates. The patient did have trouble breathing with wheezing and also some cough so antibiotics were changed to vanc and Zosyn on 09/06. The patient was seen by Dr. Belia HemanKasa. We have added Spiriva and Pulmicort nebulizers. The patient's symptoms improved and her O2 sats today are 97% on 2 liters. She is afebrile.  White count, the highest, was 26.4 then it was 11 on 09/08. Repeat chest x-ray ordered on 09/08 showed some interstitial lung disease and underlying fibrosis. The patient does have some atelectasis in the lung bases, but the patient looks better, still have chronic occasional wheeze with poor air entry. White count is normal. The patient will go to [facility] with weaning dose of steroids and Levaquin, and she can continue nebulizers and Spiriva.  2.  Hypertension. The patient also was tachycardic here in the hospital with sinus tachycardia at around 105 beats. The patient was started on Cardizem 30 mg q. 6 hours and also Aldactone that she was taking at home. We continued and the blood pressure is stable. 3.  Low back pain and hip pain. The patient was following up with Dr. Rosita KeaMenz as an outpatient. Thought to be secondary to arthritis. Seen by a physical therapist. The patient is going to [facility] rehab.  4.  Tobacco abuse, persistent. The patient still smokes whenever she gets a chance. Same is confirmed with the daughter. So I explained that she has to quit, especially with her  severe lung disease. The patient is given nicotine patch here. Counseled again about smoking. The same plan was discussed with the daughters also, and I spoke with her daughter, Thomes Dinning, and also Kennith Center during the hospital stay.  5.  Of note, the patient had some hyponatremia which improved and sodium on admission was 129 improved to 134 the next day. 6.  X-rays of the pelvis showed no fractures. Hip x-ray on the left side did not show any fracture. . The  patient's right hip x-ray did not show any fracture or dislocation. X-ray of the back showed chronic progressive fractures in L1 and L3 and (.  Condition at this time is stable. The patient is FULL CODE.   ____________________________ Katha Hamming, MD sk:sb D: 04/06/2013 12:15:43 ET T: 04/06/2013 12:57:26 ET JOB#: [id number]  cc: Katha Hamming, MD, <Dictator> Herbon E. Meredeth Ide, MD Katha Hamming MD ELECTRONICALLY SIGNED 04/21/2013 22:13

## 2014-11-18 NOTE — H&P (Signed)
PATIENT NAME:  Mindy Daniels, Mindy Daniels MR#:  [id number] DATE OF BIRTH:  September 23, 1940  DATE OF ADMISSION:  03/29/2013  REFERRING PHYSICIAN:  Dr. Lurline Hare.  PRIMARY CARE PHYSICIAN: Dr. Brynda Greathouse.   PRIMARY PULMONARY: Dr. Raul Del.   CHIEF COMPLAINT: Weakness with fall, shortness of breath.   HISTORY OF PRESENT ILLNESS: This is a 74 year old female with significant past medical history of severe COPD, hypertension, osteoporosis, pulmonary fibrosis, presents with the above-mentioned complaints. The patient reports she has been complaining of significant lower back pain and bilateral hip pain.  Reports her lower back pain has been going on for the last year or so where she been following with ortho, Dr. Rudene Christians, for several compression fractures. As well, she reports she has been complaining of bilateral lower hip pain over the last few days where she went to a walk-in clinic on August 22nd, where she had an x-ray where she was told it was an old chronic finding. The patient reports she has been feeling so weak today. The patient reports she was walking to the bathroom when she felt weak where she fell. Denies any loss of consciousness or any head trauma. In the ED, the patient was noticed hypoxic, saturating 77% on room air. The patient reports she is on oxygen at nighttime, 2.5 liters, and occasionally during the daytime. The patient had significant wheezing and tachycardia. She reports she has been using her nebulizer treatment significantly much more frequently. The patient's chest x-ray did not show any opacity or infiltrate. The patient received IV Solu-Medrol, which did help her breathing. The patient had basic blood work up done, which did show her to have mild hyponatremia at 129. The patient was noticed to be on Aldactone. As well, she had significant leukocytosis at 18,000. Her urinalysis was negative. Chest x-ray did not show any infiltrate. As well, she did not have any fever or chills. The patient has significant  polycythemia with hemoglobin more than 16.  Her baseline hemoglobin last value we have in 2011 was around 15.  The patient denies any focal deficits, any tingling, numbness, any sign of paresthesia in her lower extremities. Hospitalist service was requested to admit the patient for further management and treatment of her COPD exacerbation and chronic lower back pain.   PAST MEDICAL HISTORY: 1.  Hypertension.  2.  Vitamin D deficiency.  3.  Hypercholesterolemia.    4.  Osteoporosis.  5.  Severe COPD.  6.  Chronic respiratory failure on oxygen.  7.  Pulmonary fibrosis.   PAST SURGICAL HISTORY: 1.  Partial hysterectomy.  2.  Rectocele repair.   SOCIAL HISTORY: The patient continues to smoke half to 1 pack per day. No alcohol. No illicit drug use.   FAMILY HISTORY: Mother had history of colon cancer.   ALLERGIES:  1.  TOPROL-XL.  2.  ZESTRIL.  HOME MEDICATIONS: 1.  Spironolactone 25 mg oral daily.  2.  Acetaminophen/hydrocodone 325/5, 1 tablet 2 times a day as needed.  3.   Tramadol 50 mg every 6 hours as needed for pain.  4.  Advair Diskus 250/50, 1 puff b.i.d.  5.  Atrovent as needed.   REVIEW OF SYSTEMS:  CONSTITUTIONAL:  The patient denies fever, chills. Complains of generalized weakness and fatigue. Denies weight gain, weight loss.  EYES: Denies blurry vision, double vision, inflammation or glaucoma.  EARS, NOSE, THROAT: Denies tinnitus, ear pain, hearing loss or epistaxis or discharge.  RESPIRATORY: Complains of cough, but nonproductive. Denies hemoptysis. Painful respiration.  Reports COPD  and dyspnea.  CARDIOVASCULAR: Denies chest pain, palpitations, syncope.  Has edema at baseline.  GASTROINTESTINAL: Denies nausea, vomiting, diarrhea, abdominal pain, hematemesis, melena, jaundice.  GENITOURINARY: Denies dysuria, hematuria or renal colic.  ENDOCRINE: Denies polyuria, polydipsia, heat or cold intolerance.  HEMATOLOGY: Denies anemia, easy bruising, bleeding diathesis.   INTEGUMENT: Denies acne, rash or skin lesions.  MUSCULOSKELETAL: Complains of chronic lower back pain. Complains of bilateral hip pain. Reports history of severe osteoporosis. Has limited activity  NEUROLOGIC: Denies CVA, TIA, seizures, headache, dementia, ataxia, vertigo.  PSYCHIATRIC: Denies anxiety, insomnia, depression or schizophrenia.   PHYSICAL EXAMINATION: VITAL SIGNS: Temperature 98.6, pulse 120, respiratory rate 22, blood pressure 162/83, saturating 97% on oxygen.  GENERAL: Frail, elderly female, looks comfortable and in no apparent distress.  HEENT: The head atraumatic, normocephalic. Pupils equal, reactive to light. Pink conjunctivae. Anicteric sclerae. Moist oral mucosa.  NECK: Supple. No thyromegaly. No JVD.  CHEST: Had fair bilateral air entry with minimal wheezing. No rales or rhonchi.  CARDIOVASCULAR: S1, S2 heard. No rubs, murmurs or gallops.  ABDOMEN: Soft, nontender, nondistended. Bowel sounds present.  EXTREMITIES: +2 edema bilaterally with no clubbing or cyanosis. Dorsalis pedis pulses +2 bilaterally.  SKIN:  Fair turgor. Moist and warm.  LYMPHATICS: No cervical or supraclavicular lymph nodes.  NEUROLOGIC: Cranial nerves grossly intact. No focal deficits. Appears to be moving all extremities. Lower extremities without significant deficits, but is limited due to pain. Had positive leg raise test.  PSYCHIATRIC: Appropriate affect. Awake, alert x 3. Intact judgment and insight.   PERTINENT LABORATORY DATA: , BUN 10, creatinine 0.53, sodium 129, potassium 4.2, chloride 90, CO2 of 35. ALT 32, AST 27, alk phos 112. Troponin is 0.02. White blood cells 18.1, hemoglobin 15.2, hematocrit 47.5, platelets 281. Urinalysis negative for leukocyte esterase and nitrite. ABG showing pH of 7.44, pCO2 of 50, pO2 of 66 on nasal cannula.   RADIOLOGIC DATA:  Chest x-ray showing chronic changes of COPD and fibrosis with possible left lung base atelectasis versus infiltrate.   ASSESSMENT AND  PLAN: 1.  Chronic obstructive pulmonary disease exacerbation: The patient will be started on Solu-Medrol, as well continue her on Advair and nebulizer treatment at home. We will continue her on O2 to keep her O2 sat more than 95.  2.  Leukocytosis, unclear etiology, possibly stress induced. There is possible infiltrate on the chest x-ray, so we will start treatment for healthcare-acquired pneumonia. We will start her on Rocephin and Zithromax. The patient has negative urinalysis.  3.  Healthcare-acquired pneumonia. Continue with Rocephin and Zithromax.  4.  Bilateral hip pain. This is most likely due to arthritis versus osteoporosis, as well there is possibility of radiculopathy.  We will check x-ray and will consult PT.  5.  Polycythemia, this is most likely secondary due to chronic hypoxemia from smoking.  6.  Hypertension. We will hold the patient's Aldactone secondary to hyponatremia.  7.  Hyponatremia. Hold Aldactone. Monitor closely.  8.  Tobacco abuse. The patient was counseled. Will be started on NicoDerm patch while in the hospital .  9.  Deep vein thrombosis prophylaxis, subQ heparin.  10.  Gastrointestinal prophylaxis on Protonix.   CODE STATUS: FULL CODE.   TOTAL TIME SPENT ON ADMISSION AND PATIENT CARE: 55 minutes.    ____________________________ Albertine Patricia, MD dse:dmm D: 03/29/2013 07:42:00 ET T: 03/29/2013 10:35:37 ET JOB#: [id number]  cc: Albertine Patricia, MD, <Dictator> Maksymilian Mabey Graciela Husbands MD ELECTRONICALLY SIGNED 03/30/2013 2:54

## 2014-11-27 DEATH — deceased
